# Patient Record
Sex: Male | Born: 1935 | Race: White | Hispanic: No | Marital: Married | Smoking: Never smoker
Health system: Southern US, Community
[De-identification: ages and names within clinical notes are randomized; demographics above are authoritative.]

## PROBLEM LIST (undated history)

## (undated) DIAGNOSIS — C801 Malignant (primary) neoplasm, unspecified: Secondary | ICD-10-CM

## (undated) DIAGNOSIS — I10 Essential (primary) hypertension: Secondary | ICD-10-CM

## (undated) DIAGNOSIS — E785 Hyperlipidemia, unspecified: Secondary | ICD-10-CM

## (undated) HISTORY — PX: ELBOW SURGERY: SHX618

## (undated) HISTORY — PX: BLADDER SURGERY: SHX569

---

## 2001-05-14 ENCOUNTER — Encounter (INDEPENDENT_AMBULATORY_CARE_PROVIDER_SITE_OTHER): Payer: Self-pay

## 2001-05-14 ENCOUNTER — Ambulatory Visit (HOSPITAL_COMMUNITY): Admission: RE | Admit: 2001-05-14 | Discharge: 2001-05-14 | Payer: Self-pay | Admitting: *Deleted

## 2003-09-21 ENCOUNTER — Ambulatory Visit (HOSPITAL_COMMUNITY): Admission: RE | Admit: 2003-09-21 | Discharge: 2003-09-21 | Payer: Self-pay | Admitting: *Deleted

## 2003-09-21 ENCOUNTER — Encounter (INDEPENDENT_AMBULATORY_CARE_PROVIDER_SITE_OTHER): Payer: Self-pay | Admitting: Specialist

## 2005-08-29 ENCOUNTER — Ambulatory Visit (HOSPITAL_COMMUNITY): Admission: RE | Admit: 2005-08-29 | Discharge: 2005-08-29 | Payer: Self-pay | Admitting: Orthopedic Surgery

## 2005-11-25 ENCOUNTER — Emergency Department (HOSPITAL_COMMUNITY): Admission: EM | Admit: 2005-11-25 | Discharge: 2005-11-26 | Payer: Self-pay | Admitting: Emergency Medicine

## 2006-05-24 ENCOUNTER — Ambulatory Visit (HOSPITAL_COMMUNITY): Admission: RE | Admit: 2006-05-24 | Discharge: 2006-05-24 | Payer: Self-pay | Admitting: *Deleted

## 2006-05-24 ENCOUNTER — Encounter (INDEPENDENT_AMBULATORY_CARE_PROVIDER_SITE_OTHER): Payer: Self-pay | Admitting: Specialist

## 2006-10-15 ENCOUNTER — Emergency Department (HOSPITAL_COMMUNITY): Admission: EM | Admit: 2006-10-15 | Discharge: 2006-10-15 | Payer: Self-pay | Admitting: Emergency Medicine

## 2007-09-13 IMAGING — CR DG CHEST 2V
2 series · 2 of 2 positions shown · non-contrast
Comparison: none

CLINICAL DATA: Syncope.
 CHEST - 2 VIEW:

[w chest pa]
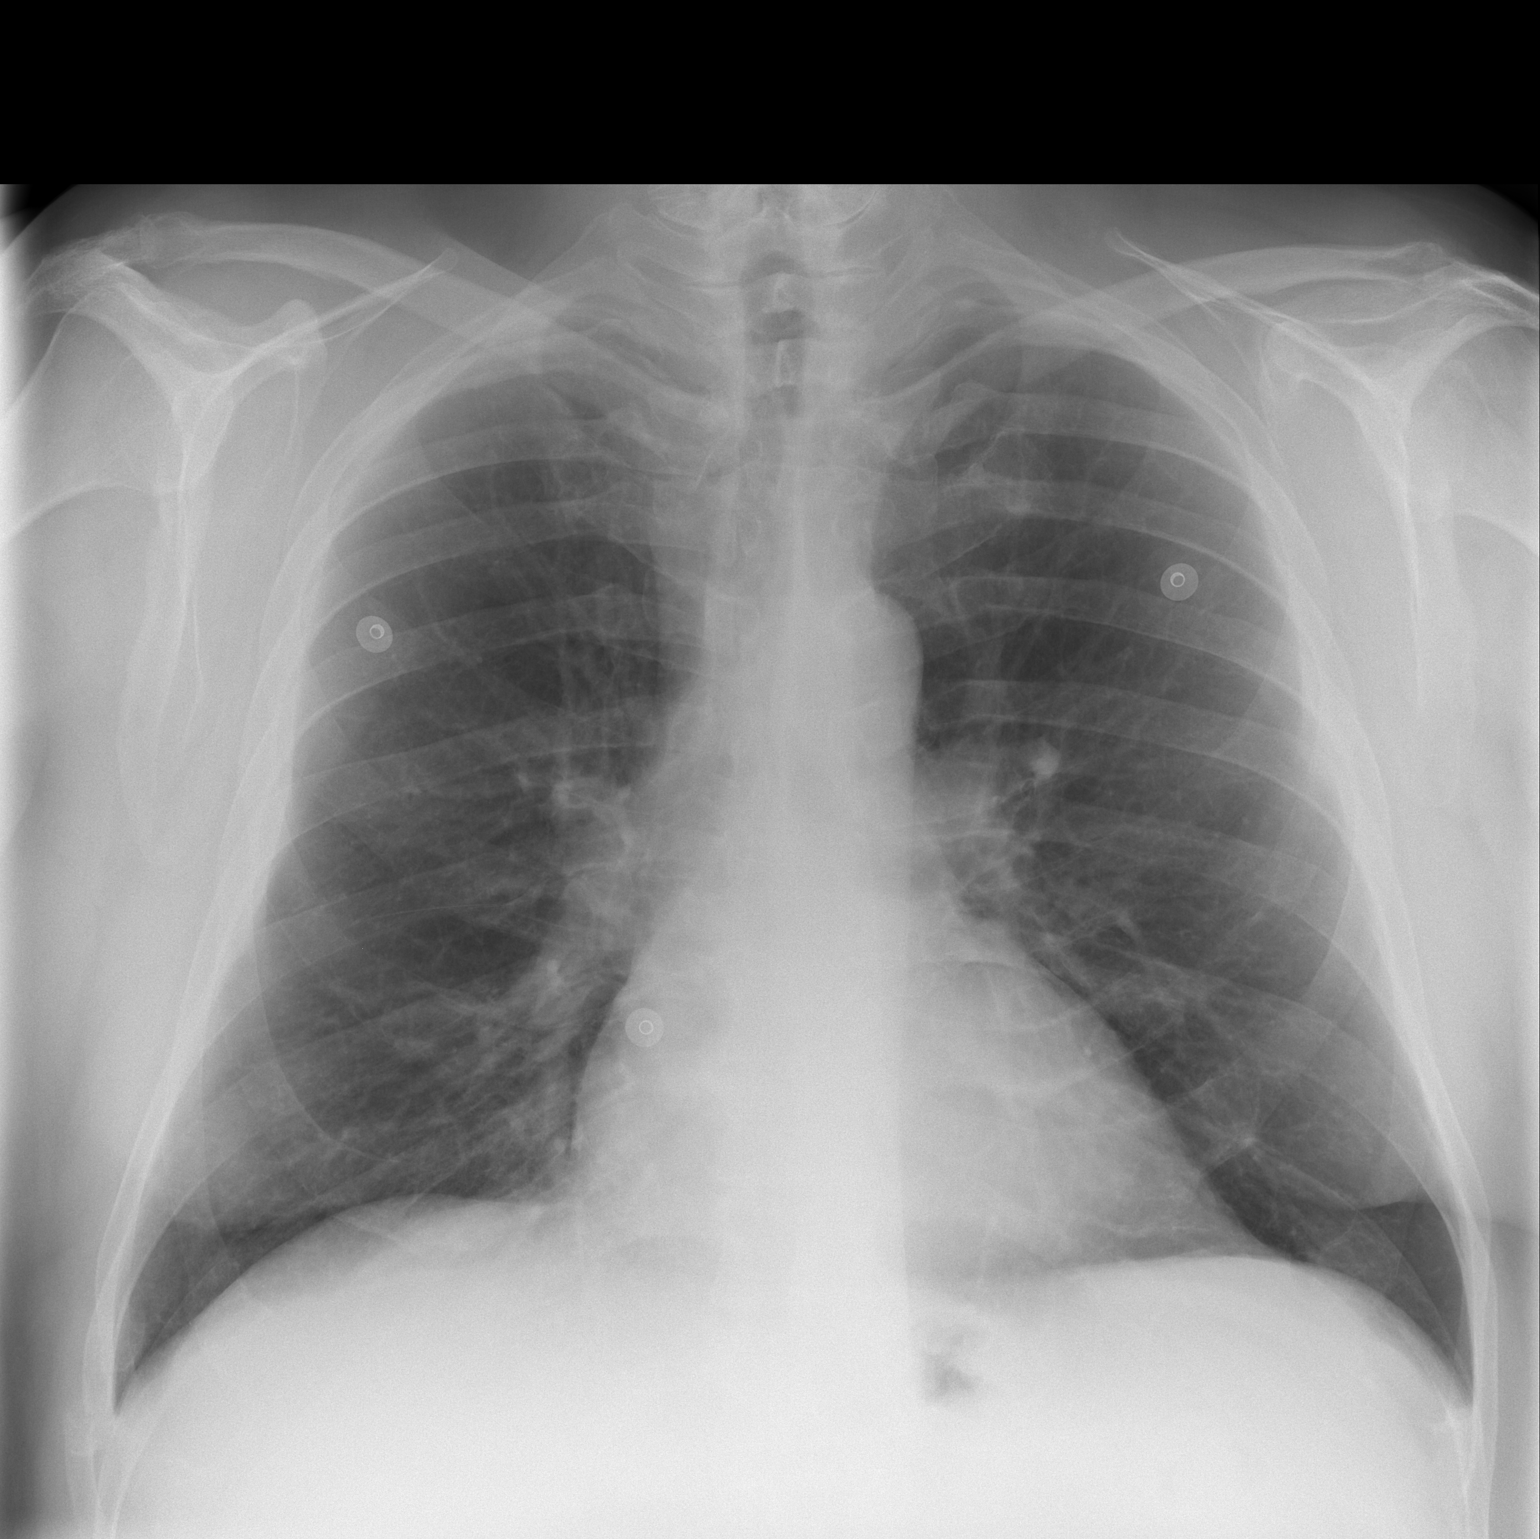

[w chest lat]
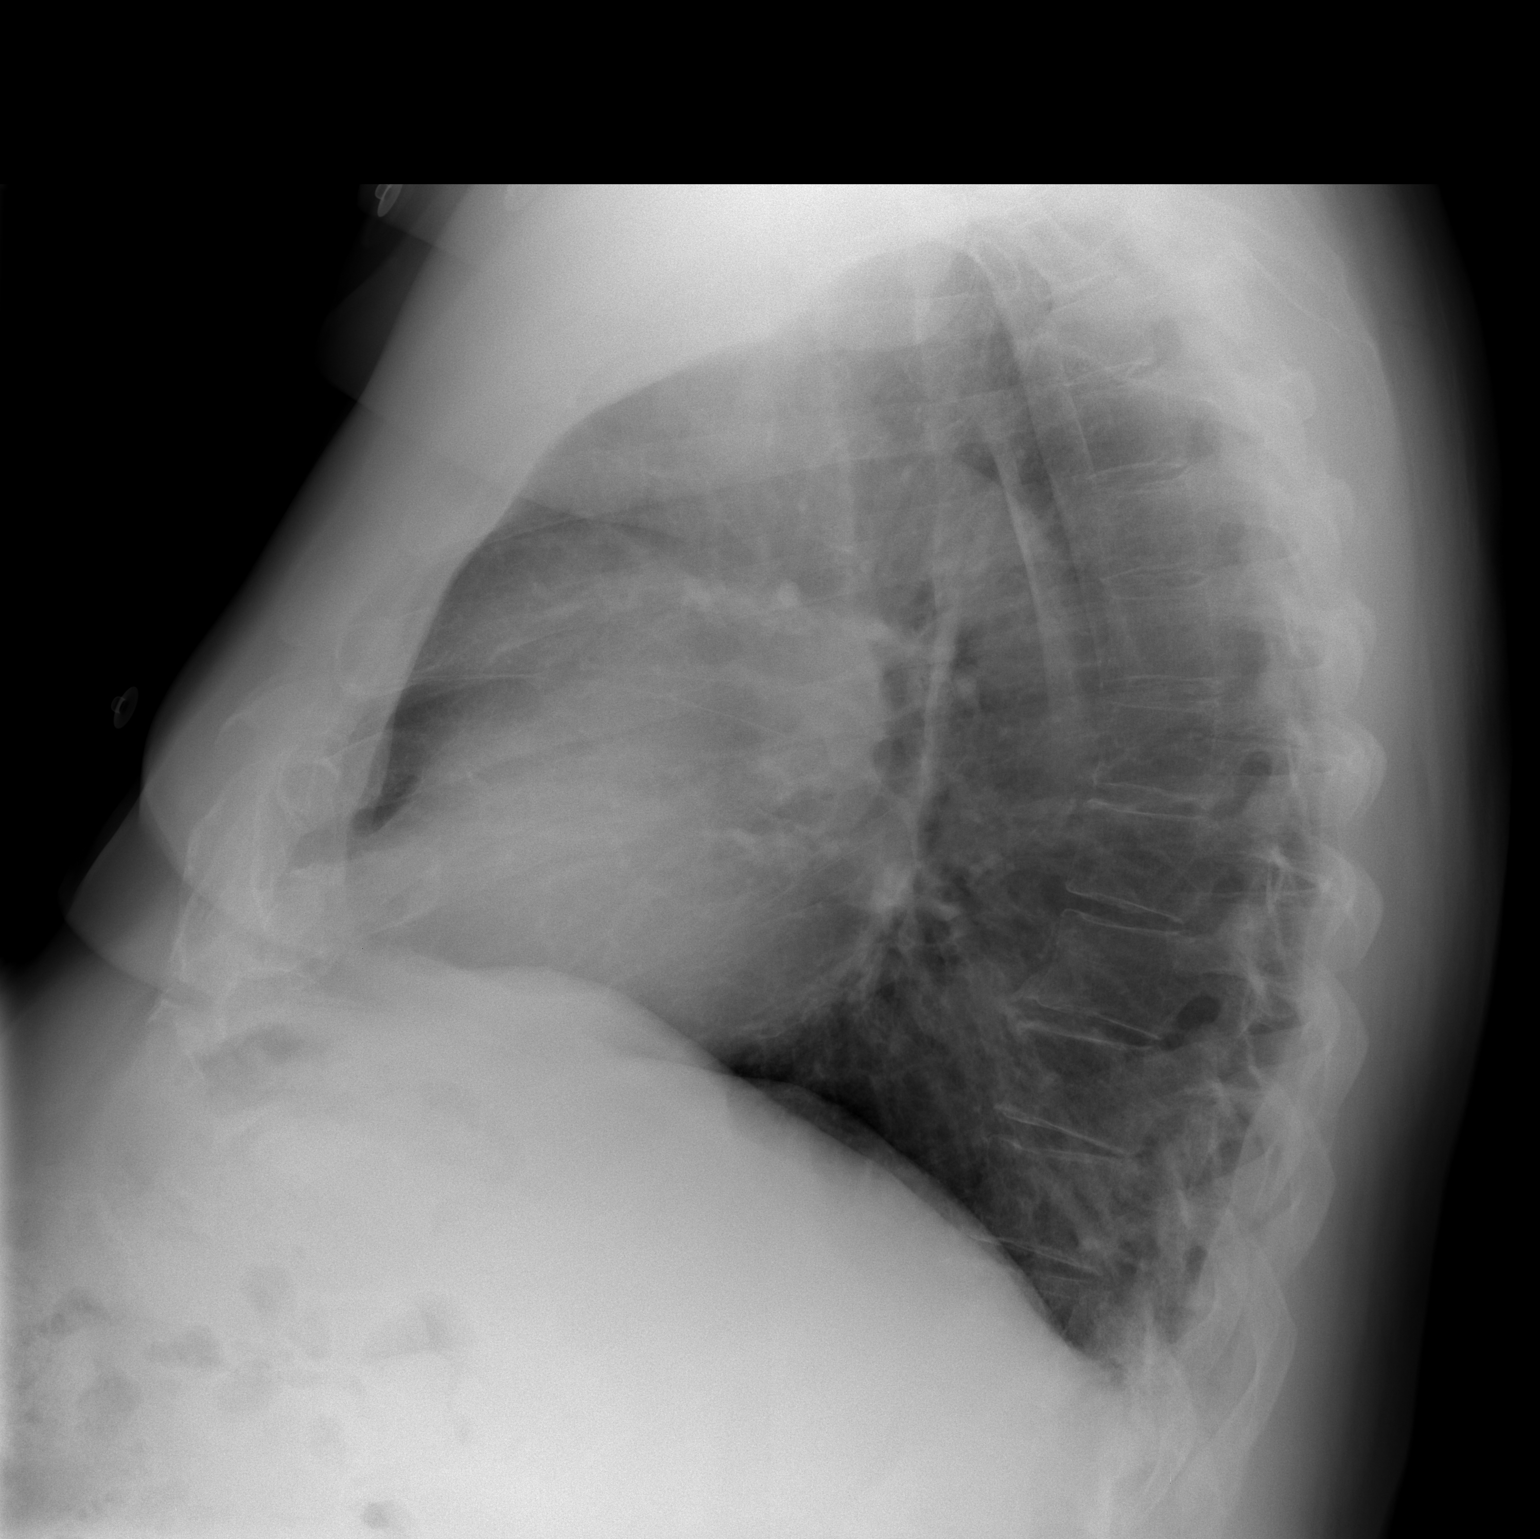

[2 of 2 positions shown; findings below may reference images not displayed]

FINDINGS: The lungs are clear.  Heart and mediastinum appear normal.  Costophrenic angles sharp.  No focal bony abnormality.
IMPRESSION: No acute disease.

## 2007-09-13 IMAGING — CT CT HEAD W/O CM
1 series · 16 of 30 positions shown, 20 images · IV contrast (agent unspecified)
Comparison: None

CLINICAL DATA: Syncope.  
 HEAD CT WITHOUT CONTRAST:
TECHNIQUE: Contiguous axial images were obtained from the base of the skull through the vertex according to standard protocol without contrast.

[Series 2: headseq 4.8 h45s · axial · 0.46mm/px · z∈[-152,+3]mm · 16 of 36 slices shown, 20 images]
[im 2/36  brain]
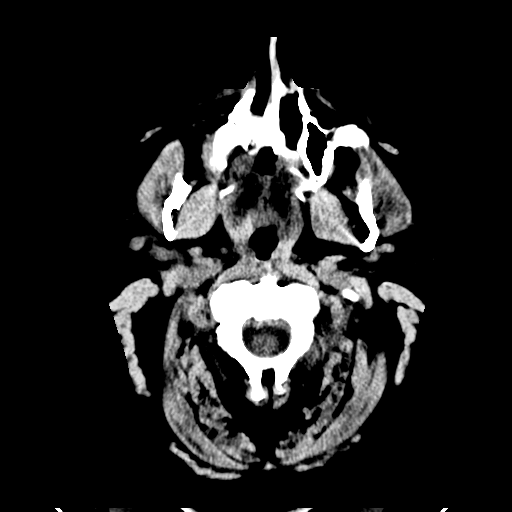
[im 2/36  bone]
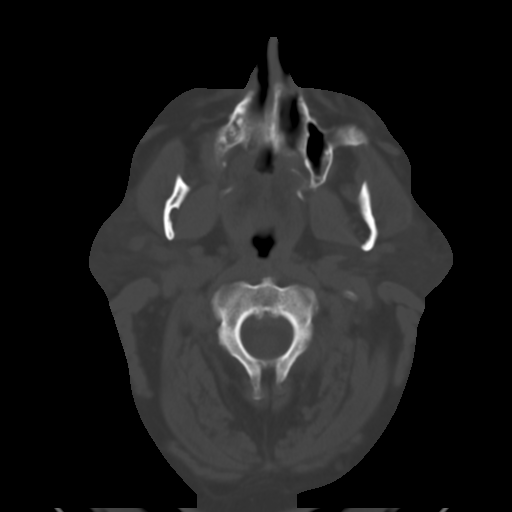
[im 4/36  brain]
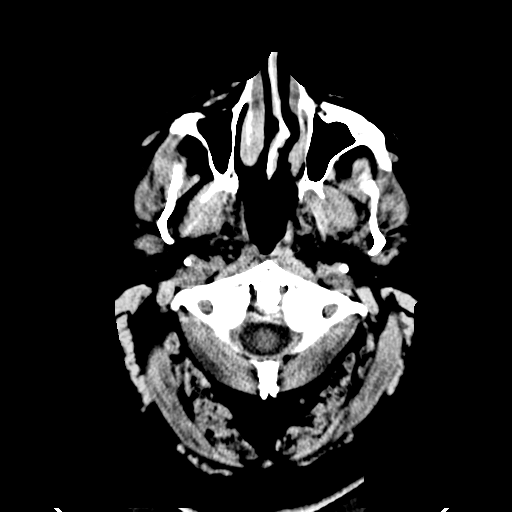
[im 7/36  brain]
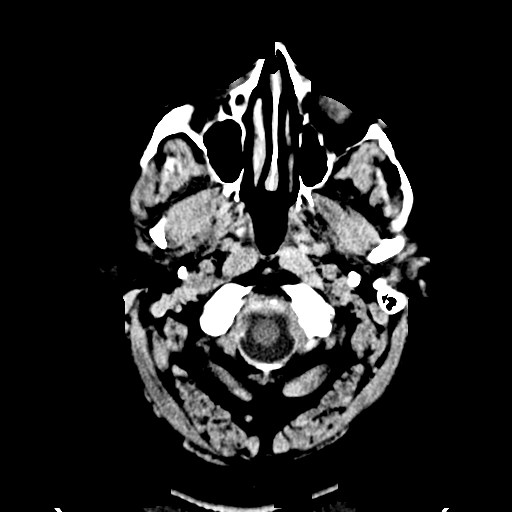
[im 9/36  brain]
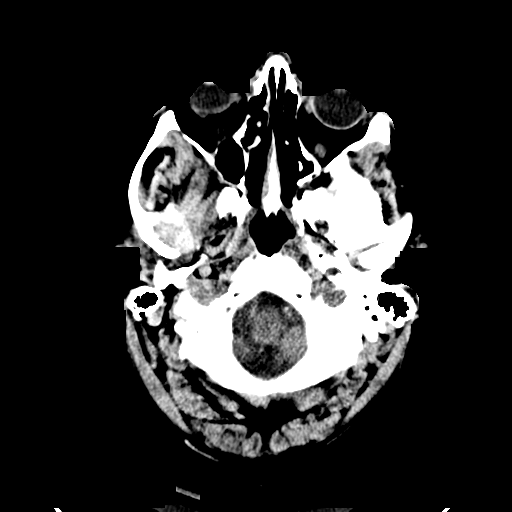
[im 10/36  brain]
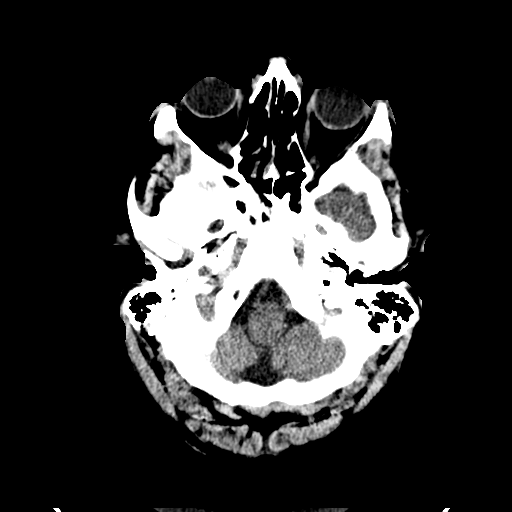
[im 10/36  bone]
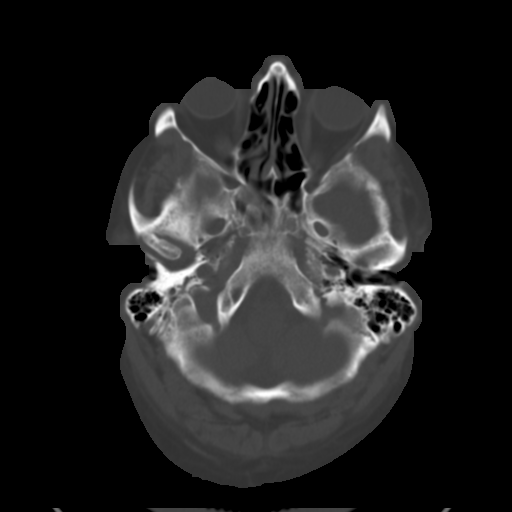
[im 13/36  brain]
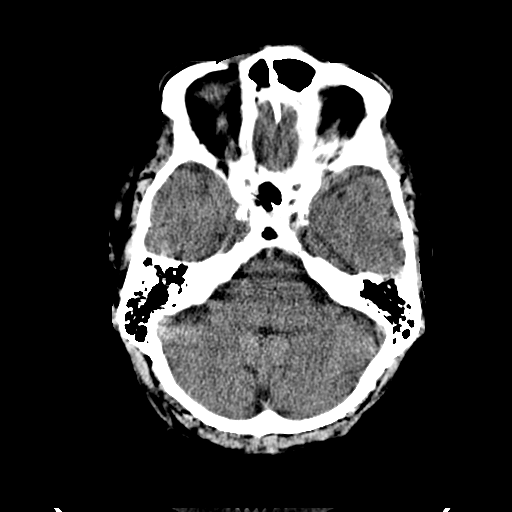
[im 15/36  brain]
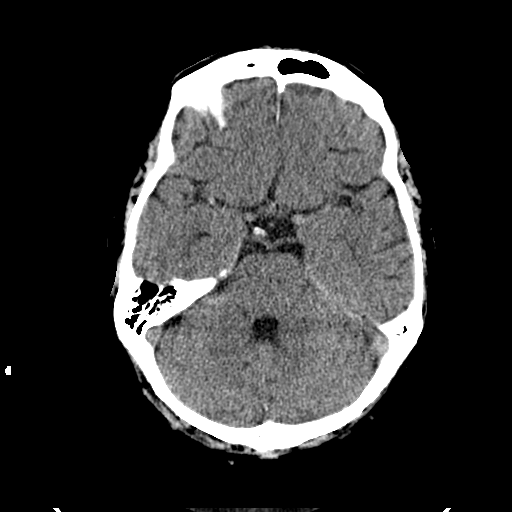
[im 17/36  brain]
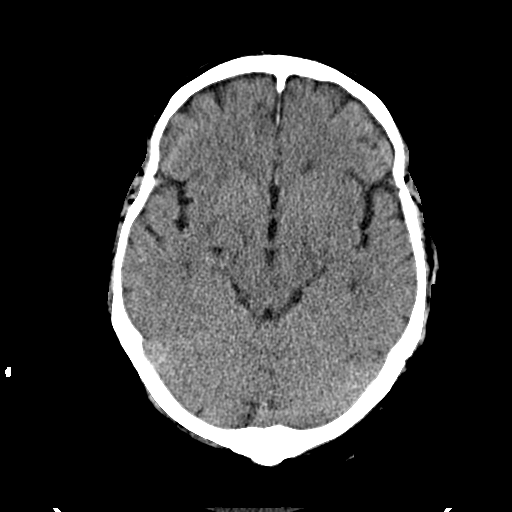
[im 19/36  brain]
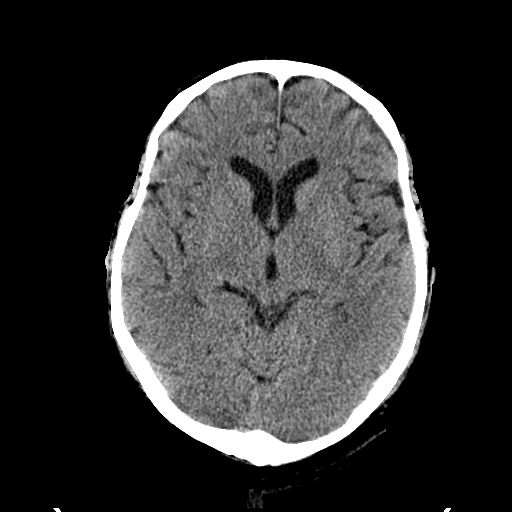
[im 19/36  bone]
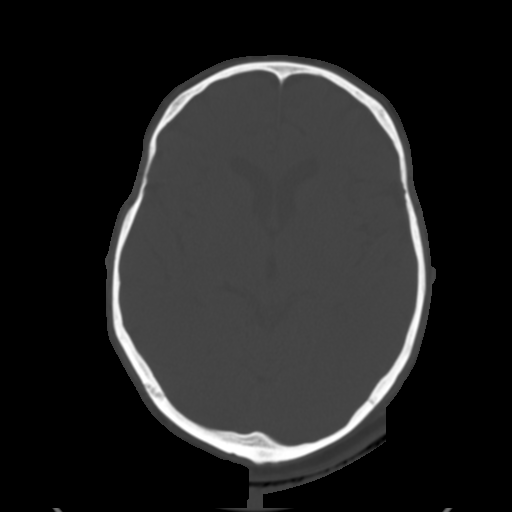
[im 21/36  brain]
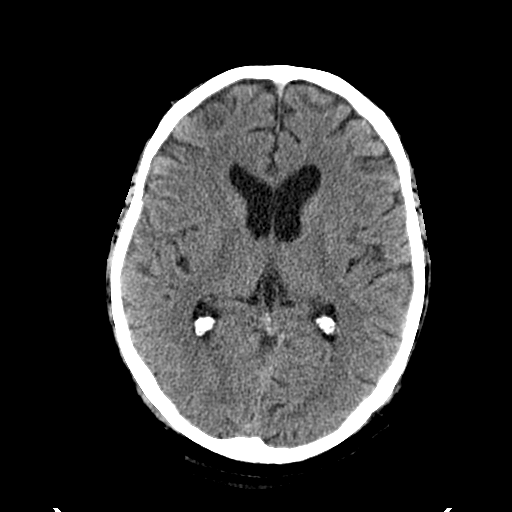
[im 23/36  brain]
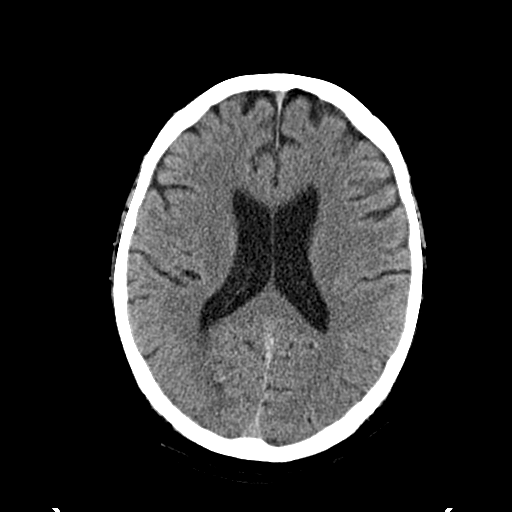
[im 26/36  brain]
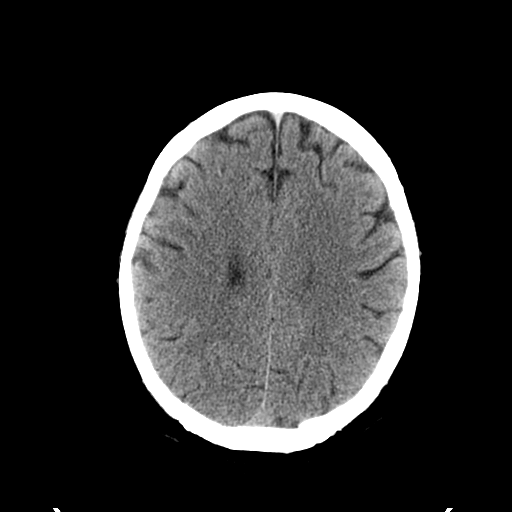
[im 27/36  brain]
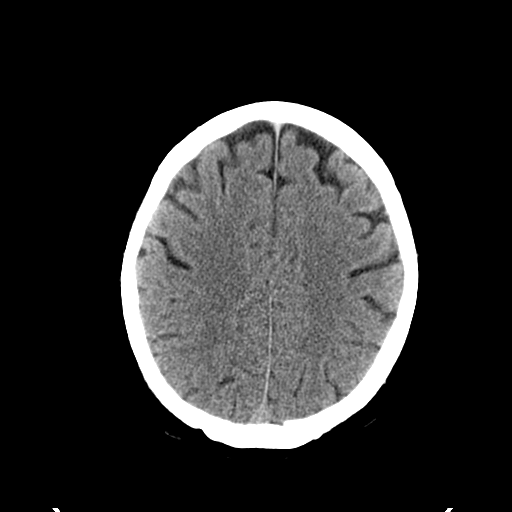
[im 27/36  bone]
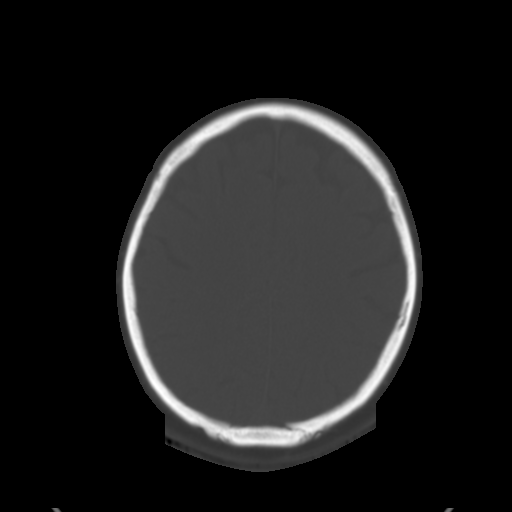
[im 29/36  brain]
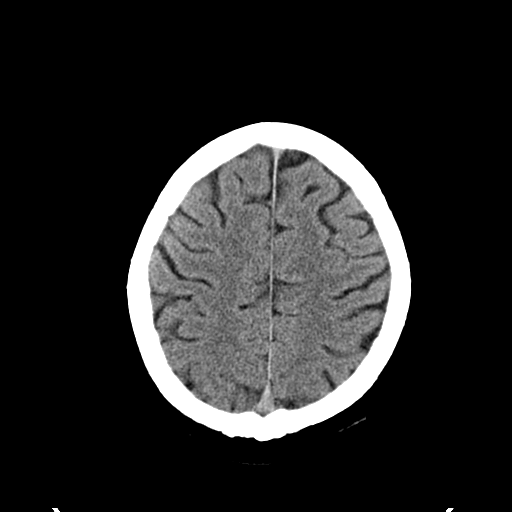
[im 32/36  brain]
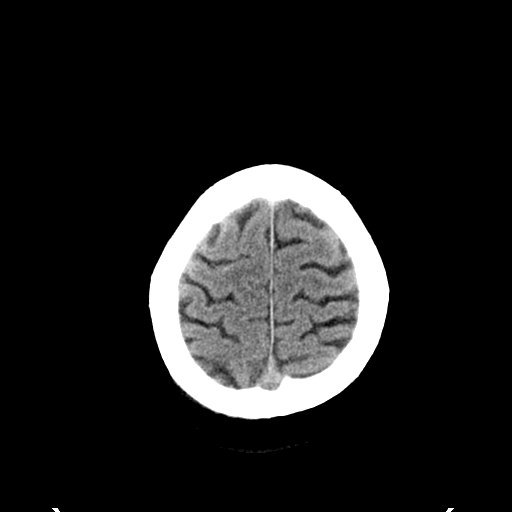
[im 34/36  brain]
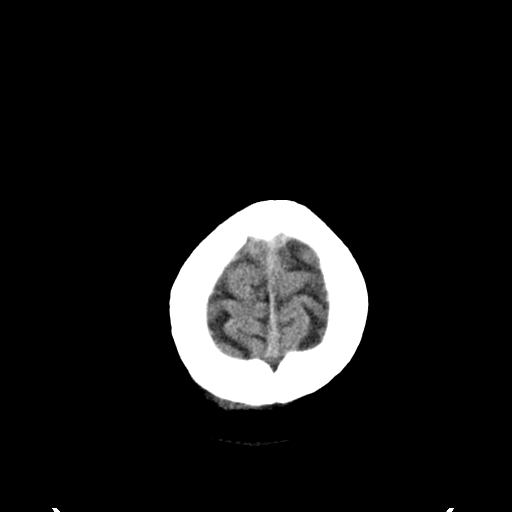

[16 of 30 positions shown; findings below may reference images not displayed]

FINDINGS: There is no evidence of acute intracranial abnormality including hemorrhage, infarct, mass, mass effect, midline shift or abnormal extraaxial fluid collection.   No hydrocephalus.  Imaged paranasal sinuses and mastoid air cells are clear.  No focal bony abnormality.
IMPRESSION: No acute intracranial abnormality.  Negative exam.

## 2009-06-16 ENCOUNTER — Encounter: Admission: RE | Admit: 2009-06-16 | Discharge: 2009-06-16 | Payer: Self-pay | Admitting: Internal Medicine

## 2010-06-30 NOTE — Procedures (Signed)
[facility]  Patient:    Jose Nash, Jose Nash. Visit Number: [id number] MRN: [id number]          Service Type: Attending:  Sabino Gasser, M.D. Dictated by:   Sabino Gasser, M.D.                             Procedure Report  CONTINUATION:  ADDENDUM TO END OF PROCEDURE:  Prep was Visicol tablets.  Well-tolerated by the patient and prep was good. Dictated by:   Sabino Gasser, M.D. Attending:  Sabino Gasser, M.D. DD:  05/14/01 TD:  05/15/01 Job: [id number]

## 2010-06-30 NOTE — Procedures (Signed)
[facility]  Patient:    Jose Nash, Jose Nash Visit Number: [id number] MRN: [id number]          Service Type: Attending:  Sabino Gasser, M.D. Dictated by:   Sabino Gasser, M.D.   CC:         Lacretia Leigh. Quintella Reichert, M.D.   Procedure Report  PROCEDURE:  Upper endoscopy.  SURGEON:  Sabino Gasser, M.D.  INDICATIONS:  GERD and dysphagia.  ANESTHESIA:  Demerol 40 and Versed 5 mg.  DESCRIPTION OF PROCEDURE:  With the patient mildly sedated in the left lateral decubitus position, the Olympus videoscopic endoscope was inserted in the mouth and passed under direct vision through the esophagus.  The distal esophagus was approached and showed a nodular stricture right at the GE junction.  This was photographed.  We entered into the stomach.  The fundus, body, antrum, duodenal bulb, and second portion of duodenum all appeared normal.  From this point, the endoscope was slowly withdrawn, taking circumferential views of the entire duodenal mucosa until the endoscope had been pulled back into the stomach and placed on retroflexion to view the stomach from below.  The endoscope was then straightened and placed in the distal stomach and a guidewire was passed.  The endoscope was removed, taking circumferential views of the remaining gastric and esophageal mucosa. Subsequently, over the guidewire, a Savary 15 mm and 19 mm dilators were passed with the last.  The guidewire was removed.  The endoscope was reinserted and a minimal amount of bleeding as to be expected was noted, indicating correct dilation of the distal stricture.  No other abnormalities were seen and the scope was withdrawn.  The patients vital signs and pulse oximeter remained stable.  The patient tolerated the procedure well without apparent complications.  FINDINGS:  Successful stricture dilation to 15 and 19 Savary dilation.  PLAN:  Liquids today, resume regular diet tomorrow, and have patient to  follow up with me as an outpatient to assess clinical response.  Proceed to colonoscopy as planned. Dictated by:   Sabino Gasser, M.D. Attending:  Sabino Gasser, M.D. DD:  05/14/01 TD:  05/15/01 Job: [id number]

## 2010-06-30 NOTE — Op Note (Signed)
NAME:  Jose Nash, HEMME NO.:  [id number]   MEDICAL RECORD NO.:  [id number]          PATIENT TYPE:  AMB   LOCATION:  ENDO                         FACILITY:  [facility]   PHYSICIAN:  Georgiana Spinner, M.D.    DATE OF BIRTH:  1933-06-20   DATE OF PROCEDURE:  DATE OF DISCHARGE:                               OPERATIVE REPORT   PROCEDURE:  Colonoscopy.   INDICATIONS:  Colon polyps, colon cancer screening.   ANESTHESIA:  Fentanyl 50 mcg, Versed 5 mg.   PROCEDURE:  With the patient mildly sedated in the left lateral  decubitus position, the Pentax videoscopic colonoscope was inserted into  the rectum and passed under direct vision to the cecum identified by the  ileocecal valve and appendiceal orifice, both of which were  photographed.  From this point, the colonoscope was slowly withdrawn,  taking circumferential views of the colonic mucosa, stopping in the  descending colon where two polyps were seen, photographed, and removed  using hot biopsy forceps technique and snare cautery technique, both  with a setting of 20/200 blended current.  We withdrew then all the way  to the rectum, stopping only at 40 cm from the anal verge at which point  another polyp was seen.  It too was removed using hot biopsy forceps  technique with the same settings.  In the rectum, the endoscope was  placed in retroflexed view to view the anal canal from above.  Internal  hemorrhoids were seen and photographed.  The endoscope was straightened  and withdrawn.  The patient's vital signs and pulse oximeter remained  stable.  The patient tolerated the procedure well without apparent  complications.   FINDINGS:  1. Occasional diverticulum of sigmoid colon.  2. Internal hemorrhoids.  3. Polyps of at 40 cm from the anal verge and in the descending colon.   PLAN:  Await biopsy report.  The patient will call me for results and  follow up with me as an outpatient.     ______________________________  Georgiana Spinner, M.D.     GMO/MEDQ  D:  05/24/2006  T:  05/24/2006  Job:  [id number]

## 2010-06-30 NOTE — Op Note (Signed)
NAME:  Jose Nash, Jose Nash                       ACCOUNT NO.:  [id number]   MEDICAL RECORD NO.:  [id number]                   PATIENT TYPE:  AMB   LOCATION:  ENDO                                 FACILITY:  [facility]   PHYSICIAN:  Georgiana Spinner, M.D.                 DATE OF BIRTH:  [birth year]   DATE OF PROCEDURE:  09/21/2003  DATE OF DISCHARGE:                                 OPERATIVE REPORT   PROCEDURE:  Colonoscopy.   DESCRIPTION OF PROCEDURE:  With the patient mildly sedated in the left  lateral decubitus position, the Olympus videoscopic colonoscope was inserted  into the rectum  and passed under direct vision to the cecum -- identified  by the ileocecal valve and appendiceal orifice, both of which are  photographed.  From this point the colonoscope was slowly withdrawn, taking  circumferential views of the colonic mucosa; stopping first in the  descending colon, just distal to the splenic flexure (where multiple polyps  were seen and removed).  The first polyp was a large polyp and it was  removed using snare cautery technique.  However, the nurse pulled through  this without applying current, therefore we had to cauterize the base of  this with a hot biopsy forceps.  There were other smaller polyps in this  area that were removed by hot biopsy forceps and/or snare cautery technique  -- all at a setting of 20/200 with the Erbe pulse generator.  The latter of  which was a large enough polyp that it could not be suctioned through the  endoscope, so it was grasped with a Ross retrieval mat and pulled out to be  retrieved.   The endoscope was then reinserted back to this level and withdrawn, taking  circumferential views of the remaining colonic mucosa; stopping in the  rectum at approximately 20 cm from the anal verge, from which point another  large polyp was seen, photographed and removed use snare cautery technique  (setting was 20/200 blended current).  This was retrieved and by  sectioning  to the end of the endoscope.  The endoscope was then reinserted to this  level and the rectum was viewed, which appeared normal otherwise in direct  view and showed hemorrhoids on retroflexed view.   The endoscope was straightened and withdrawn.  The patient's vital signs and  pulse oximetry remained stable.  The patient tolerated the procedure well  and all without apparent complications.   FINDINGS:  This was a prolonged exam because of the numerous numbers of  polyps, especially in the descending colon, just distal to the splenic  flexure that were removed with both snare cautery technique and hot biopsy  forceps technique.  There was some concern for bleeding because of the  removal of one polyp done without cautery, although the remainder of the  base was grasped with a hot biopsy forceps to cauterize it.  There was also  a polyp in the rectum and hemorrhoids noted as well.   PLAN:  Await biopsy report.  The patient will call me for results and follow  up with me as an outpatient.  Avoid anticoagulants and NSAIDs for the next  two weeks.                                               Georgiana Spinner, M.D.    GMO/MEDQ  D:  09/21/2003  T:  09/21/2003  Job:  [id number]

## 2010-06-30 NOTE — Op Note (Signed)
NAME:  Jose Nash, Jose Nash                       ACCOUNT NO.:  [id number]   MEDICAL RECORD NO.:  [id number]                   PATIENT TYPE:  AMB   LOCATION:  ENDO                                 FACILITY:  [facility]   PHYSICIAN:  Georgiana Spinner, M.D.                 DATE OF BIRTH:  04/17/1933   DATE OF PROCEDURE:  DATE OF DISCHARGE:                                 OPERATIVE REPORT   PROCEDURE:  Upper endoscopy.   INDICATIONS FOR PROCEDURE:  Dysphagia with esophagitis.   ANESTHESIA:  Demerol 50, Versed 4 mg.   DESCRIPTION OF PROCEDURE:  With the patient mildly sedated in the left  lateral decubitus position, the Olympus videoscopic endoscope was inserted  in the mouth, passed under direct vision through the esophagus which showed  change of the esophagitis, moderate in nature, probably grade B to grade C  with an ulcer of [place] grading.  This was photographed and biopsies  taken.  We entered into the stomach.  Fundus, body, antrum, duodenal bulb  and second portion of duodenum all appeared normal.  From this point, the  endoscope was slowly withdrawn taking circumferential views of the lateral  mucosa until the endoscope had been pulled back in the stomach, placed in  retroflexion to view the stomach from below.  The endoscope was then  straightened and withdrawn, taking circumferential views of the gastric and  esophageal mucosa.  The patient's vital signs and pulse oximeter remained  stable.  The patient tolerated the procedure well without apparent  complications.   FINDINGS:  Changes of esophagitis LA class C with ulceration.   PLAN:  Did not dilate because of the ulcer, therefore will plan on treating  more aggressively with PPI and await clinical response.  Will have the  patient follow up with me as an outpatient and for the results of the  biopsies.                                               Georgiana Spinner, M.D.    GMO/MEDQ  D:  09/21/2003  T:  09/21/2003  Job:   [id number]

## 2010-06-30 NOTE — Procedures (Signed)
[facility]  Patient:    Jose Nash, Jose Nash. Visit Number: [id number] MRN: [id number]          Service Type: Attending:  Sabino Gasser, M.D. Dictated by:   Sabino Gasser, M.D.   CC:         Lacretia Leigh. Quintella Reichert, M.D.   Procedure Report  PROCEDURE:  Colonoscopy.  SURGEON:  Sabino Gasser, M.D.  INDICATIONS:  Hemoccult positivity, colon cancer screening.  ANESTHESIA:  Demerol 10 and Versed one extra milligram.  DESCRIPTION OF PROCEDURE:  With the patient mildly sedated in the left lateral decubitus position, a rectal exam was performed which was unremarkable. Subsequently, the Olympus videoscopic colonoscope was inserted into the rectum and passed under direct vision to the cecum, identified by the ileocecal valve and appendiceal orifice.  From this point, the colonoscope was slowly withdrawn, taking circumferential views of the entire colonic mucosa, stopping first and approximately two folds removed from the ileocecal valve, at which point, a small sessile polyp was seen, photographed, and removed using hot biopsy forceps technique, setting of 20/20 blunted current.  We stopped next at approximately 45 cm from the anal verge, at which point, two other polyps were seen fairly adjacent to each other.  They were also small and somewhat sessile.  One was removed using snare cautery technique, the other with hot biopsy forceps technique, both with a setting of 20/20 blunted current as there was some diverticulosis noted in the sigmoid colon as we pulled back to the rectum which appeared normal on direct and retroflexed view.  The endoscope was then straightened and withdrawn.  The patients vital signs and pulse oximeter remained stable.  The patient tolerated the procedure well and without apparent complications.  FINDINGS:  Polyps of the ascending colon and in the sigmoid area at approximately 45 cm from the anal verge, await biopsy report.  Diverticulosis of  sigmoid colon.  PLAN:  See endoscopy note as well.  We will have the patient call me for the results of the biopsy and follow up with me as an outpatient. Dictated by:   Sabino Gasser, M.D. Attending:  Sabino Gasser, M.D. DD:  05/14/01 TD:  05/15/01 Job: [id number]

## 2010-06-30 NOTE — Consult Note (Signed)
NAME:  Jose Nash, Jose Nash NO.:  [id number]   MEDICAL RECORD NO.:  [id number]          PATIENT TYPE:  EMS   LOCATION:  ED                           FACILITY:  [facility]   PHYSICIAN:  Thora Lance, M.D.  DATE OF BIRTH:  Jun 25, 1933   DATE OF CONSULTATION:  DATE OF DISCHARGE:  11/26/2005                                   CONSULTATION   CHIEF COMPLAINT:  Syncope.   HISTORY OF PRESENT ILLNESS:  This is a 77 year old white male with a history  of hyperlipidemia, depression, gout, hypertension, who presents with two  episodes of syncope.  He was driving home today from the store.  He got out  of the car and his grandchildren came up to meet him.  He squatted down and  hugged his first grandchild and then the second and afterwards, sudden onset  of a syncopal episode.  He fell back and hit his head.  He got up  immediately and felt fine and went into the house.  He was blowing up some  balloons for his grandchildren.  After blowing the balloons, he immediately  had a coughing fit. This again resulted in the presyncopal episode.  He  denies any chest pain, shortness of breath or palpitations.  He has had a  congested cough productive of a small amount of phlegm over the last five  days.  He has had no history of syncope.   PAST MEDICAL HISTORY:  1. Hypertension.  2. Depression.  3. Hyperlipidemia.   MEDICATIONS:  1. Prilosec.  2. Simvastatin.  3. Aspirin.  4. Lexapro.   ALLERGIES:  NO KNOWN DRUG ALLERGIES.   FAMILY HISTORY:  Not addressed.   SOCIAL HISTORY:  Married.  Nonsmoker, moderate alcohol.   PHYSICAL EXAMINATION:  GENERAL APPEARANCE:  A well-appearing white male.  VITAL SIGNS:  Blood pressure __________, blood pressure 143/84, heart rate  79, respirations 20, O2 saturation 95% on room air.  HEENT:  Oropharynx clear.  NECK:  Supple.  LUNGS:  Clear.  CARDIOVASCULAR:  Regular rate and rhythm without murmurs, rubs, or gallops.  ABDOMEN:  Soft, nontender, no  mass or hepatosplenomegaly.  EXTREMITIES:  No edema.  NEUROLOGIC:  Nonfocal.   LABORATORY DATA:  CBC with wbc dipped to 5.2, hemoglobin 12.9, platelet  count 204.  Sodium 130, potassium 3.7, chloride 102, bicarbonate 27, glucose  129, BUN 11, creatinine 1.3, calcium 9.4.   Chest x-ray with no acute disease.  CT scan of the brain with no acute  disease.   EKG with normal sinus rhythm, normal EKG.   ASSESSMENT:  1. Two episodes of syncope, both related to maneuvers which would decrease      return of blood to the thorax.  In one case, he had prolonged      squatting.  In the second case, he was blowing up a balloon and then      had a coughing fit.  2. Acute bronchitis.   PLAN:  1. Discharge to home.  2. Plenty of fluids.  3. Z-pack.  4. No driving until I see him in the office in  five days.  He will call      for an appointment.  5. If he has any further syncope or develops any chest pain, shortness of      breath or palpitations, let me know.           ______________________________  Thora Lance, M.D.     JJG/MEDQ  D:  11/26/2005  T:  11/26/2005  Job:  [id number]

## 2010-10-18 ENCOUNTER — Other Ambulatory Visit: Payer: Self-pay | Admitting: Gastroenterology

## 2011-04-30 ENCOUNTER — Other Ambulatory Visit: Payer: Self-pay | Admitting: Dermatology

## 2013-03-19 ENCOUNTER — Inpatient Hospital Stay (HOSPITAL_COMMUNITY): Payer: Medicare Other

## 2013-03-19 ENCOUNTER — Encounter (HOSPITAL_COMMUNITY): Payer: Self-pay | Admitting: Internal Medicine

## 2013-03-19 ENCOUNTER — Inpatient Hospital Stay (HOSPITAL_COMMUNITY)
Admission: AD | Admit: 2013-03-19 | Discharge: 2013-03-24 | DRG: 871 | Disposition: A | Discharge status: Home Health | Payer: Medicare Other | Source: Other Acute Inpatient Hospital | Attending: Internal Medicine | Admitting: Internal Medicine

## 2013-03-19 DIAGNOSIS — I517 Cardiomegaly: Secondary | ICD-10-CM

## 2013-03-19 DIAGNOSIS — N390 Urinary tract infection, site not specified: Secondary | ICD-10-CM | POA: Diagnosis present

## 2013-03-19 DIAGNOSIS — E785 Hyperlipidemia, unspecified: Secondary | ICD-10-CM | POA: Diagnosis present

## 2013-03-19 DIAGNOSIS — Z932 Ileostomy status: Secondary | ICD-10-CM

## 2013-03-19 DIAGNOSIS — R7881 Bacteremia: Secondary | ICD-10-CM | POA: Diagnosis present

## 2013-03-19 DIAGNOSIS — N179 Acute kidney failure, unspecified: Secondary | ICD-10-CM | POA: Diagnosis present

## 2013-03-19 DIAGNOSIS — C679 Malignant neoplasm of bladder, unspecified: Secondary | ICD-10-CM | POA: Diagnosis present

## 2013-03-19 DIAGNOSIS — N183 Chronic kidney disease, stage 3 unspecified: Secondary | ICD-10-CM | POA: Diagnosis present

## 2013-03-19 DIAGNOSIS — D72829 Elevated white blood cell count, unspecified: Secondary | ICD-10-CM | POA: Diagnosis present

## 2013-03-19 DIAGNOSIS — A4159 Other Gram-negative sepsis: Principal | ICD-10-CM | POA: Diagnosis present

## 2013-03-19 DIAGNOSIS — R6520 Severe sepsis without septic shock: Secondary | ICD-10-CM

## 2013-03-19 DIAGNOSIS — Z7982 Long term (current) use of aspirin: Secondary | ICD-10-CM

## 2013-03-19 DIAGNOSIS — E872 Acidosis, unspecified: Secondary | ICD-10-CM | POA: Diagnosis present

## 2013-03-19 DIAGNOSIS — I10 Essential (primary) hypertension: Secondary | ICD-10-CM | POA: Diagnosis present

## 2013-03-19 DIAGNOSIS — I4891 Unspecified atrial fibrillation: Secondary | ICD-10-CM | POA: Diagnosis not present

## 2013-03-19 DIAGNOSIS — R6521 Severe sepsis with septic shock: Secondary | ICD-10-CM

## 2013-03-19 DIAGNOSIS — N039 Chronic nephritic syndrome with unspecified morphologic changes: Secondary | ICD-10-CM

## 2013-03-19 DIAGNOSIS — Z8551 Personal history of malignant neoplasm of bladder: Secondary | ICD-10-CM

## 2013-03-19 DIAGNOSIS — N200 Calculus of kidney: Secondary | ICD-10-CM | POA: Diagnosis present

## 2013-03-19 DIAGNOSIS — I129 Hypertensive chronic kidney disease with stage 1 through stage 4 chronic kidney disease, or unspecified chronic kidney disease: Secondary | ICD-10-CM | POA: Diagnosis present

## 2013-03-19 DIAGNOSIS — Z79899 Other long term (current) drug therapy: Secondary | ICD-10-CM

## 2013-03-19 DIAGNOSIS — B9689 Other specified bacterial agents as the cause of diseases classified elsewhere: Secondary | ICD-10-CM

## 2013-03-19 DIAGNOSIS — D649 Anemia, unspecified: Secondary | ICD-10-CM | POA: Diagnosis present

## 2013-03-19 DIAGNOSIS — D631 Anemia in chronic kidney disease: Secondary | ICD-10-CM | POA: Diagnosis present

## 2013-03-19 DIAGNOSIS — A419 Sepsis, unspecified organism: Secondary | ICD-10-CM | POA: Diagnosis present

## 2013-03-19 DIAGNOSIS — N189 Chronic kidney disease, unspecified: Secondary | ICD-10-CM | POA: Diagnosis present

## 2013-03-19 HISTORY — DX: Essential (primary) hypertension: I10

## 2013-03-19 HISTORY — DX: Hyperlipidemia, unspecified: E78.5

## 2013-03-19 HISTORY — DX: Malignant (primary) neoplasm, unspecified: C80.1

## 2013-03-19 LAB — BASIC METABOLIC PANEL
BUN: 61 mg/dL — ABNORMAL HIGH (ref 6–23)
CALCIUM: 8.2 mg/dL — AB (ref 8.4–10.5)
CO2: 16 mEq/L — ABNORMAL LOW (ref 19–32)
CREATININE: 3.55 mg/dL — AB (ref 0.50–1.35)
Chloride: 101 mEq/L (ref 96–112)
GFR, EST AFRICAN AMERICAN: 18 mL/min — AB (ref >90)
GFR, EST NON AFRICAN AMERICAN: 15 mL/min — AB (ref >90)
Glucose, Bld: 132 mg/dL — ABNORMAL HIGH (ref 70–99)
Potassium: 4.7 mEq/L (ref 3.7–5.3)
Sodium: 134 mEq/L — ABNORMAL LOW (ref 137–147)

## 2013-03-19 LAB — URINALYSIS, ROUTINE W REFLEX MICROSCOPIC
BILIRUBIN URINE: NEGATIVE
GLUCOSE, UA: NEGATIVE mg/dL
Ketones, ur: NEGATIVE mg/dL
Nitrite: NEGATIVE
PH: 7 (ref 5.0–8.0)
Protein, ur: 100 mg/dL — AB
SPECIFIC GRAVITY, URINE: 1.011 (ref 1.005–1.030)
UROBILINOGEN UA: 0.2 mg/dL (ref 0.0–1.0)

## 2013-03-19 LAB — URINE MICROSCOPIC-ADD ON

## 2013-03-19 LAB — CBC WITH DIFFERENTIAL/PLATELET
Basophils Absolute: 0 10*3/uL (ref 0.0–0.1)
Basophils Relative: 0 % (ref 0–1)
EOS ABS: 0.1 10*3/uL (ref 0.0–0.7)
EOS PCT: 1 % (ref 0–5)
HCT: 30.9 % — ABNORMAL LOW (ref 39.0–52.0)
HEMOGLOBIN: 10.7 g/dL — AB (ref 13.0–17.0)
LYMPHS ABS: 0.6 10*3/uL — AB (ref 0.7–4.0)
LYMPHS PCT: 4 % — AB (ref 12–46)
MCH: 29.8 pg (ref 26.0–34.0)
MCHC: 34.6 g/dL (ref 30.0–36.0)
MCV: 86.1 fL (ref 78.0–100.0)
MONOS PCT: 4 % (ref 3–12)
Monocytes Absolute: 0.6 10*3/uL (ref 0.1–1.0)
Neutro Abs: 12.7 10*3/uL — ABNORMAL HIGH (ref 1.7–7.7)
Neutrophils Relative %: 91 % — ABNORMAL HIGH (ref 43–77)
Platelets: 139 10*3/uL — ABNORMAL LOW (ref 150–400)
RBC: 3.59 MIL/uL — AB (ref 4.22–5.81)
RDW: 15.5 % (ref 11.5–15.5)
WBC: 14.1 10*3/uL — AB (ref 4.0–10.5)

## 2013-03-19 LAB — COMPREHENSIVE METABOLIC PANEL
ALK PHOS: 55 U/L (ref 39–117)
ALT: 11 U/L (ref 0–53)
AST: 14 U/L (ref 0–37)
Albumin: 2.6 g/dL — ABNORMAL LOW (ref 3.5–5.2)
BUN: 60 mg/dL — AB (ref 6–23)
CO2: 17 meq/L — AB (ref 19–32)
Calcium: 8.2 mg/dL — ABNORMAL LOW (ref 8.4–10.5)
Chloride: 103 mEq/L (ref 96–112)
Creatinine, Ser: 3.76 mg/dL — ABNORMAL HIGH (ref 0.50–1.35)
GFR calc Af Amer: 16 mL/min — ABNORMAL LOW (ref >90)
GFR, EST NON AFRICAN AMERICAN: 14 mL/min — AB (ref >90)
Glucose, Bld: 106 mg/dL — ABNORMAL HIGH (ref 70–99)
Potassium: 4.4 mEq/L (ref 3.7–5.3)
Sodium: 138 mEq/L (ref 137–147)
TOTAL PROTEIN: 6.7 g/dL (ref 6.0–8.3)
Total Bilirubin: 0.3 mg/dL (ref 0.3–1.2)

## 2013-03-19 LAB — GLUCOSE, CAPILLARY
GLUCOSE-CAPILLARY: 117 mg/dL — AB (ref 70–99)
GLUCOSE-CAPILLARY: 99 mg/dL (ref 70–99)
GLUCOSE-CAPILLARY: 160 mg/dL — AB (ref 70–99)
Glucose-Capillary: 128 mg/dL — ABNORMAL HIGH (ref 70–99)
Glucose-Capillary: 129 mg/dL — ABNORMAL HIGH (ref 70–99)

## 2013-03-19 LAB — SODIUM, URINE, RANDOM: Sodium, Ur: 56 mEq/L

## 2013-03-19 LAB — AMMONIA: Ammonia: 30 umol/L (ref 11–60)

## 2013-03-19 LAB — PRO B NATRIURETIC PEPTIDE: Pro B Natriuretic peptide (BNP): 5160 pg/mL — ABNORMAL HIGH (ref 0–450)

## 2013-03-19 LAB — TSH: TSH: 0.752 u[IU]/mL (ref 0.350–4.500)

## 2013-03-19 LAB — LACTIC ACID, PLASMA: Lactic Acid, Venous: 0.8 mmol/L (ref 0.5–2.2)

## 2013-03-19 LAB — MRSA PCR SCREENING: MRSA by PCR: NEGATIVE

## 2013-03-19 LAB — TROPONIN I: Troponin I: <0.3 ng/mL (ref <0.30)

## 2013-03-19 LAB — CREATININE, URINE, RANDOM: CREATININE, URINE: 87.62 mg/dL

## 2013-03-19 LAB — PROTIME-INR
INR: 1.28 (ref 0.00–1.49)
PROTHROMBIN TIME: 15.7 s — AB (ref 11.6–15.2)

## 2013-03-19 LAB — APTT: aPTT: 42 seconds — ABNORMAL HIGH (ref 24–37)

## 2013-03-19 MED ORDER — HYDRALAZINE HCL 20 MG/ML IJ SOLN: 10.0000 mg | INTRAMUSCULAR | Status: DC | PRN

## 2013-03-19 MED ORDER — DILTIAZEM HCL 25 MG/5ML IV SOLN: 25.0000 mg | Freq: Once | INTRAVENOUS | Status: DC

## 2013-03-19 MED ORDER — DILTIAZEM HCL 25 MG/5ML IV SOLN
10.0000 mg | Freq: Once | INTRAVENOUS | Status: AC
  Administered 2013-03-19: 10 mg via INTRAVENOUS
  Filled 2013-03-19: qty 5

## 2013-03-19 MED ORDER — ACETAMINOPHEN 650 MG RE SUPP
650.0000 mg | Freq: Four times a day (QID) | RECTAL | Status: DC | PRN
Start: 2013-03-19 — End: 2013-03-24

## 2013-03-19 MED ORDER — SODIUM CHLORIDE 0.9 % IV SOLN
INTRAVENOUS | Status: AC
  Administered 2013-03-19: 06:00:00 via INTRAVENOUS

## 2013-03-19 MED ORDER — LEVOFLOXACIN IN D5W 750 MG/150ML IV SOLN
750.0000 mg | INTRAVENOUS | Status: AC
  Administered 2013-03-19: 750 mg via INTRAVENOUS
  Filled 2013-03-19: qty 150

## 2013-03-19 MED ORDER — DEXTROSE 5 % IV SOLN
5.0000 mg/h | INTRAVENOUS | Status: DC
  Administered 2013-03-19: 15 mg/h via INTRAVENOUS
  Administered 2013-03-19: 5 mg/h via INTRAVENOUS
  Administered 2013-03-19: 15 mg/h via INTRAVENOUS
  Administered 2013-03-19: 20 mg/h via INTRAVENOUS
  Administered 2013-03-20: 5 mg/h via INTRAVENOUS
  Filled 2013-03-19 (×6): qty 100

## 2013-03-19 MED ORDER — ONDANSETRON HCL 4 MG/2ML IJ SOLN
4.0000 mg | Freq: Four times a day (QID) | INTRAMUSCULAR | Status: DC | PRN
Start: 2013-03-19 — End: 2013-03-24

## 2013-03-19 MED ORDER — LEVOFLOXACIN IN D5W 500 MG/100ML IV SOLN
500.0000 mg | INTRAVENOUS | Status: DC
  Administered 2013-03-21: 500 mg via INTRAVENOUS
  Filled 2013-03-19: qty 100

## 2013-03-19 MED ORDER — ACETAMINOPHEN 325 MG PO TABS
650.0000 mg | ORAL_TABLET | Freq: Four times a day (QID) | ORAL | Status: DC | PRN
  Administered 2013-03-20: 650 mg via ORAL
  Filled 2013-03-19: qty 2

## 2013-03-19 MED ORDER — HEPARIN (PORCINE) IN NACL 100-0.45 UNIT/ML-% IJ SOLN
2750.0000 [IU]/h | INTRAMUSCULAR | Status: DC
  Administered 2013-03-19: 1600 [IU]/h via INTRAVENOUS
  Administered 2013-03-20: 1900 [IU]/h via INTRAVENOUS
  Administered 2013-03-21: 2450 [IU]/h via INTRAVENOUS
  Administered 2013-03-21: 2300 [IU]/h via INTRAVENOUS
  Administered 2013-03-22 – 2013-03-23 (×3): 2750 [IU]/h via INTRAVENOUS
  Filled 2013-03-19 (×13): qty 250

## 2013-03-19 MED ORDER — ONDANSETRON HCL 4 MG PO TABS
4.0000 mg | ORAL_TABLET | Freq: Four times a day (QID) | ORAL | Status: DC | PRN
Start: 2013-03-19 — End: 2013-03-24

## 2013-03-19 MED ORDER — SODIUM CHLORIDE 0.9 % IJ SOLN
3.0000 mL | Freq: Two times a day (BID) | INTRAMUSCULAR | Status: DC
  Administered 2013-03-19 – 2013-03-24 (×10): 3 mL via INTRAVENOUS

## 2013-03-19 MED ORDER — PIPERACILLIN-TAZOBACTAM 3.375 G IVPB
3.3750 g | Freq: Three times a day (TID) | INTRAVENOUS | Status: DC
  Administered 2013-03-19: 3.375 g via INTRAVENOUS
  Filled 2013-03-19 (×2): qty 50

## 2013-03-19 MED ORDER — DEXTROSE 5 % IV SOLN
2.0000 g | INTRAVENOUS | Status: DC
  Administered 2013-03-19 – 2013-03-21 (×3): 2 g via INTRAVENOUS
  Filled 2013-03-19 (×4): qty 2

## 2013-03-19 MED ORDER — HEPARIN BOLUS VIA INFUSION
5000.0000 [IU] | Freq: Once | INTRAVENOUS | Status: AC
  Administered 2013-03-19: 5000 [IU] via INTRAVENOUS
  Filled 2013-03-19: qty 5000

## 2013-03-19 NOTE — Progress Notes (Signed)
  Echocardiogram 2D Echocardiogram has been performed.  CANE, DUBRAY 03/19/2013, 5:53 PM

## 2013-03-19 NOTE — Progress Notes (Signed)
Patient ID: Jose Nash, male   DOB: 1933/03/31, 80 y.o.   MRN: [id number] Assuming care, H and P noted.  Patient developed A fib with RVR today, HD stable with DOE, give second diltiazem bolus and start diltiazem drip, check CXR, 2D echo and cardiac enzymes and BNP.

## 2013-03-19 NOTE — Consult Note (Signed)
Name: Jose Nash MRN: [id number] DOB: 11-Sep-1933    ADMISSION DATE:  03/19/2013 CONSULTATION DATE:  03/19/13  REFERRING MD :  Dr. Hal Hope PRIMARY SERVICE: PCCM  CHIEF COMPLAINT:  UTI, ACKD, hypotension  BRIEF PATIENT DESCRIPTION: 80yo male with hx bladder cancer s/p diverting ileostomy. Recent urology workup for nephrolithiasis passed spontaneously with CT abd showing perinephric stranding, also with WBC 30K, rising SCr, refractory hypotension to 4L NS, and GN bacteremia. Now having mental status changes and developed afib with RVR. Tx to [facility] by patient request for further workup and treatment of sepsis.  SIGNIFICANT EVENTS / STUDIES:  2/5 >> Transferred to [facility] for workup of septic shock  LINES / TUBES: End ileostomy>>>permanent PIV 2/3>>>  CULTURES: MRSA neg BC 2/5>>>gram neg rod>>> Urine 2/5>>>  ANTIBIOTICS: Zosyn 2/5>>>  HISTORY OF PRESENT ILLNESS:  80 yo caucasian male with PMH bladder cancer with diverting ileostomy, HTN, and nephrolithiasis who is followed by urology outpatient. Recently patient developed flank pain, fevers, chills, and sweating approx 5 days ago and underwent evaluation by his urologist on Tuesday 2/3. CT abdomen/pelvis showed passage of a 50mm stone and perinephric stranding. He had a reported WBC 30K and rising SCr (baseline 1.5) up to 2.5-3.1. Blood cultures revealed GNR and patient was started on Zosyn. He then developed AMS and afib with RVR and was transferred to [facility] for further workup.   PAST MEDICAL HISTORY :  Past Medical History  Diagnosis Date  . Hypertension   . Cancer   . HLD (hyperlipidemia)    Past Surgical History  Procedure Laterality Date  . Bladder surgery    . Elbow surgery     Prior to Admission medications   Not on File   No Known Allergies  FAMILY HISTORY:  Family History  Problem Relation Age of Onset  . Bladder Cancer Neg Hx    SOCIAL HISTORY:  reports that he has never smoked. He does not have any smokeless  tobacco history on file. He reports that he does not drink alcohol or use illicit drugs.  REVIEW OF SYSTEMS: N pos, vom pos, all else neg  SUBJECTIVE: no distress  VITAL SIGNS: Temp:  [98.1 F (36.7 C)-99.7 F (37.6 C)] 99.7 F (37.6 C) (02/05 0700) Pulse Rate:  [86-138] 138 (02/05 0730) Resp:  [20-30] 27 (02/05 0730) BP: (100-146)/(46-101) 113/60 mmHg (02/05 0700) SpO2:  [95 %-100 %] 95 % (02/05 0730) Weight:  [110.1 kg (242 lb 11.6 oz)] 110.1 kg (242 lb 11.6 oz) (02/05 0345) HEMODYNAMICS:   VENTILATOR SETTINGS:   INTAKE / OUTPUT: Intake/Output     02/04 0701 - 02/05 0700 02/05 0701 - 02/06 0700   I.V. (mL/kg) 41.3 (0.4)    Total Intake(mL/kg) 41.3 (0.4)    Urine (mL/kg/hr) 100    Total Output 100     Net -58.8            PHYSICAL EXAMINATION: General appearance - alert, well appearing, and in no distress Mental status - alert, oriented to person, place, and time Eyes - pupils equal and reactive, extraocular eye movements intact Mouth - mucous membranes moist, pharynx normal without lesions Chest - clear to auscultation, no wheezes, rales or rhonchi, symmetric air entry Heart - irregularly irregular rhythm with rate >110 Abdomen - soft, nontender, nondistended, no masses or organomegaly GU Male - diverting ileostomy bag in place with clear, yellow urine Neurological - alert, oriented, normal speech, no focal findings or movement disorder noted Musculoskeletal - no joint tenderness, deformity  or swelling Extremities - peripheral pulses normal, no pedal edema, no clubbing or cyanosis Skin - normal coloration and turgor, no rashes, no suspicious skin lesions noted LABS:  CBC No results found for this basename: WBC, HGB, HCT, PLT,  in the last 168 hours Coag's No results found for this basename: APTT, INR,  in the last 168 hours BMET No results found for this basename: NA, K, CL, CO2, BUN, CREATININE, GLUCOSE,  in the last 168 hours Electrolytes No results found for  this basename: CALCIUM, MG, PHOS,  in the last 168 hours Sepsis Markers No results found for this basename: LATICACIDVEN, PROCALCITON, O2SATVEN,  in the last 168 hours ABG No results found for this basename: PHART, PCO2ART, PO2ART,  in the last 168 hours Liver Enzymes No results found for this basename: AST, ALT, ALKPHOS, BILITOT, ALBUMIN,  in the last 168 hours Cardiac Enzymes No results found for this basename: TROPONINI, PROBNP,  in the last 168 hours Glucose No results found for this basename: GLUCAP,  in the last 168 hours  Imaging No results found.   CXR: no infiltrate, small nodule?  ASSESSMENT / PLAN:  PULMONARY A: ?Left basilar atelectasis vs infiltrate - mild at best P:   Repeat CXR in am Continue [place], goal SpO2 > 92%, wean to off as tolerated Give IS Follow for failure with volume given  CARDIOVASCULAR A:  Afib w/ RVR Hx HTN P:  Start cardizem drip Start Heparin drip  2D echo F/u troponin x 1, r/o demand ischemia tsh Lactic acid reassuring Hold off on line, unless amio needed or drop BP cortisol  RENAL A:   Acute on Chronic renal failure  CKDIII (baseline SCr 1.5) Decreased UOP Hx bladder cancer s/p cystectomy and diverting ileostomy Perinephric stranding on CT abd/pelvis AG acidosis, from uremia?, lactic maybe hiring prior P:   Renal US now r/o hydro BMET in am May need urology abx bmet in pm   GASTROINTESTINAL A:   N/V x 3-4 days Decreased PO intake P:   Zofran PRN for nausea Diet likely to start Treat sepsis  HEMATOLOGIC A:  Anemia of CKD DVT ppx P:  Hep IV after coags reviewed, pharmacy Follow CBC  INFECTIOUS A:  Sepsis -- presumed source kidney Leukocytosis per urology records ~30K>>18K GN bacteremia per outpt records --lactose fermenting P:   Dc zosyn, add ceftaz, consider adding double GN coverage with levaquin Check lactic done Trend fever curve Follow culture results [facility] to fax [facility] results when  speciated CBC in am  ENDOCRINE A:   R/o AI fib P:   Check cortisol F/u TSH  NEUROLOGIC A:   AMS>>resolved P:   CT head? Not needed, nonfocal, exam, dc Monitor for any neuro changes  Dwyane Dee, MSIV, Medical Student 03/19/2013 9:18 AM   TODAY'S SUMMARY: fib rvr, cardizem, may need line  I have personally obtained a history, examined the patient, evaluated laboratory and imaging results, formulated the assessment and plan and placed orders. CRITICAL CARE: The patient is critically ill with multiple organ systems failure and requires high complexity decision making for assessment and support, frequent evaluation and titration of therapies, application of advanced monitoring technologies and extensive interpretation of multiple databases. Critical Care Time devoted to patient care services described in this note is 35 minutes.   Lavon Paganini. Titus Mould, MD, [place] Pgr: [facility]  Pulmonary and [place] Pager: [phone]  03/19/2013, 8:04 AM

## 2013-03-19 NOTE — Progress Notes (Signed)
I was called by the patient's that patient has become confused and patient's heart rate has increased. EKG shows A. fib with RVR. Exam at the bedside patient has mild confusion. I have ordered Cardizem 10 mg IV bolus for A. fib with RVR. I have also ordered CT head without contrast and swallow evaluation for patient's confusion at this time. Patient's mental status changes may be from sepsis but since patient has A. fib with RVR I have ordered CT head and will place on neurochecks for now. I have also consulted critical care Dr. Leonidas Romberg.  Gean Birchwood.

## 2013-03-19 NOTE — Progress Notes (Signed)
80yo male tx'd from OSH by pt request w/ AOCRF, UTI, and hypotension, GNR in blood Cx at OSH, had been on vanc and Zosyn.  Will continue Zosyn at 3.375g IV Q8H for CrCl ~25 ml/min currently but had been trending up at OSH (2.5 --> 3).  Monitor CBC, Cx, CrCl.    Wynona Neat, PharmD, BCPS 03/19/2013 7:26 AM

## 2013-03-19 NOTE — H&P (Signed)
[facility] History and Physical  Jose Nash:[id number] DOB: 08-19-1933 DOA: 03/19/2013  Referring physician: Patient was transferred from [facility] at the request of family. PCP: Provider Not In System Dr. Lavone Orn.  Chief Complaint: . Urinary tract infection.  HPI: Jose Nash is a 80 y.o. male with history of bladder cancer status post diverting ileostomy, hypertension and hyperlipidemia, nephrolithiasis was advised to go to the hospital by his urologist ordered patient was found to have significant leukocytosis and his blood work done on Tuesday last 3 days ago. Patient has been experiencing some left flank pain since last Saturday 5 days ago and had followed up with his urologist last Tuesday 3 days ago. Patient also has been having subjective feeling of fever chills and sweating. At his urology office blood work was done and patient was prescribed Augmentin. Patient also had a CT abdomen and pelvis which showed passage of a known renal stone in the left kidney which was measuring 6 mm. It also showed left perinephric stranding suggesting recent passage of stone. It showed small para stomal hernia with small bowel. When his blood work showed leukocytosis of 30,000 patient was advised to to the hospital. Patient had gone to [facility]. Over there patient was found to be hypotensive with blood pressure in the 70s. Patient was empirically started on vancomycin and Zosyn. His creatinine on admission was around 2.5 and as per the transferring physician his baseline creatinine around 1.5. Patient was initially given 2 L normal saline bolus and eventually he has received 4 L of normal saline. His blood pressure improved. His creatinine has actually worsened from 2.5-3.1. One of the patient's blood culture grew gram-negative rods. Patient was on Zosyn. Patient's urologist was contacted and as per the urologist if patient's creatinine further worsens  they have advised further renal imaging to make sure there is no hydronephrosis. And if there is hydronephrosis patient may need percutaneous nephrostomy tubes as per the urologist. Patient also was having mild mental status changes. At this point patient's family requested transfer to [facility] for further care. On exam patient is alert awake oriented and follows commands. Denies any chest pain shortness of breath abdominal pain nausea vomiting or diarrhea.  Review of Systems: As presented in the history of presenting illness, rest negative.  Past Medical History  Diagnosis Date  . Hypertension   . Cancer   . HLD (hyperlipidemia)    Past Surgical History  Procedure Laterality Date  . Bladder surgery    . Elbow surgery     Social History:  reports that he has never smoked. He does not have any smokeless tobacco history on file. He reports that he does not drink alcohol or use illicit drugs. Where does patient live  home. Can patient participate in ADLs?  Yes.  No Known Allergies  Family History:  Family History  Problem Relation Age of Onset  . Bladder Cancer Neg Hx       Prior to Admission medications   Not on File    Physical Exam: Filed Vitals:   03/19/13 0400 03/19/13 0415 03/19/13 0500 03/19/13 0530  BP: 109/64 146/62 114/66 124/101  Pulse: 88 87 90 94  Temp:      TempSrc:      Resp: 20 30 29 21   Height:      Weight:      SpO2: 99% 100% 100% 99%     General:  Well-developed and nourished.  Eyes:  Anicteric  no pallor.  ENT:  No discharge from the ears eyes nose mouth.  Neck:  No mass felt.  Cardiovascular:  S1-S2 heard.  Respiratory:  No rhonchi or crepitations.  Abdomen:  Soft nontender bowel sounds present. Urostomy bag seen.  Skin:  No rash.  Musculoskeletal:  No edema.  Psychiatric:  Appears normal.  Neurologic:  Alert awake oriented to time place and person. Moves all extremities 5 x 5.  Labs on Admission:  Basic Metabolic Panel: No  results found for this basename: NA, K, CL, CO2, GLUCOSE, BUN, CREATININE, CALCIUM, MG, PHOS,  in the last 168 hours Liver Function Tests: No results found for this basename: AST, ALT, ALKPHOS, BILITOT, PROT, ALBUMIN,  in the last 168 hours No results found for this basename: LIPASE, AMYLASE,  in the last 168 hours No results found for this basename: AMMONIA,  in the last 168 hours CBC: No results found for this basename: WBC, NEUTROABS, HGB, HCT, MCV, PLT,  in the last 168 hours Cardiac Enzymes: No results found for this basename: CKTOTAL, CKMB, CKMBINDEX, TROPONINI,  in the last 168 hours  BNP (last 3 results) No results found for this basename: PROBNP,  in the last 8760 hours CBG: No results found for this basename: GLUCAP,  in the last 168 hours  Radiological Exams on Admission: No results found.   Assessment/Plan Principal Problem:   Gram-negative bacteremia Active Problems:   Bladder cancer   Hypertension   Renal failure (ARF), acute on chronic   Anemia   Nephrolithiasis   1. Gram-negative bacteremia presently patient is status post sepsis - I have continued patient on Zosyn. I have ordered repeat blood cultures. Follow patient's final blood culture results at [facility]. Records of which may be available through [place] through [facility]. Continue gentle hydration. 2. Acute on chronic renal failure - patient's creatinine usually is on 1.5 as per the transferring physician. It has worsened to 3.1 at the time of transfer. I have ordered repeat labs which are pending. I have also ordered renal sonogram to check for any hydronephrosis. Check urine analysis and also ordered urine sodium and creatinine. Closely follow intake output and metabolic panel. Patient presently is on gentle hydration. Patient used to be on lisinopril prior to admission which has been discontinued. 3. Hypertension - presently holding off all antihypertensives and continue with gentle  hydration due to the recent sepsis the patient be hypotensive. I have placed patient on when necessary IV hydralazine for systolic blood pressure more than 160. 4. Hyperlipidemia - continue home medications. 5. Anemia - labs done at another facility shows anemia. Closely follow CBC.  I have reviewed patient's chart at [facility].   patient's repeat labs including metabolic panel CBC lactic acid blood cultures urine studies are all pending.    Code Status:  full code.  Family Communication:  none.  Disposition Plan:  admit to inpatient of Dr. Lavone Orn.    Amaree Loisel N. [facility] Pager [phone].  If 7PM-7AM, please contact night-coverage [email or web address] Password [email or web address] 03/19/2013, 5:59 AM

## 2013-03-19 NOTE — Progress Notes (Signed)
Utilization review completed. Zenda Herskowitz, RN, BSN. 

## 2013-03-19 NOTE — Care Management Note (Unsigned)
    Page 1 of 1   03/19/2013     4:40:50 PM   CARE MANAGEMENT NOTE 03/19/2013  Patient:  Jose Nash, Jose Nash   Account Number:  [id number]  Date Initiated:  03/19/2013  Documentation initiated by:  Carlena Ruybal  Subjective/Objective Assessment:   PT ADM ON 03/19/13 WITH SEPSIS, UTI, AFIB.  PTA, PT RESIDES AT [place].     Action/Plan:   PT ON HEPARIN AND CARDIZEM DRPS, IV ABX; WILL FOLLOW FOR DC PLANNING AS PT PROGRESSES.   Anticipated DC Date:  03/23/2013   Anticipated DC Plan:  [place]  CM consult      Choice offered to / List presented to:             Status of service:  In process, will continue to follow Medicare Important Message given?   (If response is "NO", the following Medicare IM given date fields will be blank) Date Medicare IM given:   Date Additional Medicare IM given:    Discharge Disposition:    Per UR Regulation:  Reviewed for med. necessity/level of care/duration of stay  If discussed at [place] of Stay Meetings, dates discussed:    Comments:

## 2013-03-19 NOTE — Progress Notes (Signed)
ANTICOAGULATION & ANTIBIOTIC CONSULT NOTE - Initial Consult  Pharmacy Consult for Heparin; Jose Nash and Levaquin Indication: atrial fibrillation; GNR bacteremia  No Known Allergies Patient Measurements: Height: 6' (182.9 cm) Weight: 242 lb 11.6 oz (110.1 kg) IBW/kg (Calculated) : 77.6 Heparin Dosing Weight: 101 kg Vital Signs: Temp: 98.4 F (36.9 C) (02/05 1153) Temp src: Oral (02/05 1153) BP: 110/59 mmHg (02/05 1215) Pulse Rate: 80 (02/05 1215) Labs:  Recent Labs  03/19/13 0750 03/19/13 0935  HGB 10.7*  --   HCT 30.9*  --   PLT 139*  --   CREATININE 3.76*  --   TROPONINI  --  <0.30   Estimated Creatinine Clearance: 21.1 ml/min (by C-G formula based on Cr of 3.76).  Medical History: Past Medical History  Diagnosis Date  . Hypertension   . Cancer   . HLD (hyperlipidemia)     Assessment: 24 YOM with new onset atrial fibrillation to start IV heparin (CHADS2 = 1). H/H low on admit and platelets have dropped (205 ([facility]HS) >>154 >> 139) after receiving sq heparin at outside hospital. Patient was not on anticoagulant prior to admission (only ASA 81mg ). PT 15.7, INR 1.28, APTT 42 (slightly high).   Infectious Disease: WBC 18.3 on 2/4. MRSA PCR neg. SCr is elevated at 3.71/estimated CrCl~21.32mL/min. UOP ~1.3 cc/kg/hr. Changing Zosyn to [place] and adding Levaquin for double coverage of infection.   Zosyn 2/4 >>2/5 Vanc (OHS) 2/3 x1 CTX (OHS) 2/4 x1 Cefepime (OHS) 2/3 x1  2/3 Blood (OHS) >> 1/2 GNR  (lactose fermenting) 2/4 Blood (OHS) >>  2/4 Resp (OHS) >> 2/4 Urine ([facility]HS) >> 2/5 UA-cloudy 2/5 Urine >> 2/5 Blood >>  Goal of Therapy:  Heparin level 0.3-0.7 units/ml Monitor platelets by anticoagulation protocol: Yes Clinical resolution of infection  Plan:  1. Heparin bolus 5000 units x1.  2. Heparin drip at 1600 units/hr.  3. Heparin level in 8 hours due to acute renal failure per protocol. 4. Fortaz 2g IV q24h. 5. Levaquin 750mg  x1 now, then 500mg  IV q48h. 6.  Monitor renal function and adjust doses as needed.  7. Monitor cultures and narrow therapy as able.   Sloan Leiter, PharmD, BCPS Clinical Pharmacist [phone] 03/19/2013,12:30 PM

## 2013-03-20 ENCOUNTER — Inpatient Hospital Stay (HOSPITAL_COMMUNITY): Payer: Medicare Other

## 2013-03-20 LAB — CBC WITH DIFFERENTIAL/PLATELET
BASOS ABS: 0 10*3/uL (ref 0.0–0.1)
Basophils Relative: 0 % (ref 0–1)
EOS PCT: 1 % (ref 0–5)
Eosinophils Absolute: 0.1 10*3/uL (ref 0.0–0.7)
HEMATOCRIT: 28.8 % — AB (ref 39.0–52.0)
Hemoglobin: 10 g/dL — ABNORMAL LOW (ref 13.0–17.0)
LYMPHS PCT: 7 % — AB (ref 12–46)
Lymphs Abs: 0.9 10*3/uL (ref 0.7–4.0)
MCH: 29.4 pg (ref 26.0–34.0)
MCHC: 34.7 g/dL (ref 30.0–36.0)
MCV: 84.7 fL (ref 78.0–100.0)
MONO ABS: 0.8 10*3/uL (ref 0.1–1.0)
MONOS PCT: 6 % (ref 3–12)
Neutro Abs: 11.9 10*3/uL — ABNORMAL HIGH (ref 1.7–7.7)
Neutrophils Relative %: 86 % — ABNORMAL HIGH (ref 43–77)
Platelets: 137 10*3/uL — ABNORMAL LOW (ref 150–400)
RBC: 3.4 MIL/uL — ABNORMAL LOW (ref 4.22–5.81)
RDW: 15.4 % (ref 11.5–15.5)
WBC: 13.7 10*3/uL — AB (ref 4.0–10.5)

## 2013-03-20 LAB — BASIC METABOLIC PANEL
BUN: 58 mg/dL — AB (ref 6–23)
CHLORIDE: 104 meq/L (ref 96–112)
CO2: 16 meq/L — AB (ref 19–32)
Calcium: 8 mg/dL — ABNORMAL LOW (ref 8.4–10.5)
Creatinine, Ser: 3.6 mg/dL — ABNORMAL HIGH (ref 0.50–1.35)
GFR calc Af Amer: 17 mL/min — ABNORMAL LOW (ref >90)
GFR calc non Af Amer: 15 mL/min — ABNORMAL LOW (ref >90)
Glucose, Bld: 126 mg/dL — ABNORMAL HIGH (ref 70–99)
Potassium: 4.5 mEq/L (ref 3.7–5.3)
Sodium: 137 mEq/L (ref 137–147)

## 2013-03-20 LAB — GLUCOSE, CAPILLARY
Glucose-Capillary: 113 mg/dL — ABNORMAL HIGH (ref 70–99)
Glucose-Capillary: 117 mg/dL — ABNORMAL HIGH (ref 70–99)

## 2013-03-20 LAB — URINE CULTURE: Colony Count: 1000

## 2013-03-20 LAB — HEPARIN LEVEL (UNFRACTIONATED)
Heparin Unfractionated: 0.13 IU/mL — ABNORMAL LOW (ref 0.30–0.70)
Heparin Unfractionated: 0.17 [IU]/mL — ABNORMAL LOW (ref 0.30–0.70)
Heparin Unfractionated: 0.21 IU/mL — ABNORMAL LOW (ref 0.30–0.70)

## 2013-03-20 MED ORDER — CALCIUM CARBONATE ANTACID 500 MG PO CHEW
400.0000 mg | CHEWABLE_TABLET | ORAL | Status: DC | PRN
  Administered 2013-03-20: 400 mg via ORAL
  Filled 2013-03-20: qty 2

## 2013-03-20 MED ORDER — SODIUM BICARBONATE 8.4 % IV SOLN
INTRAVENOUS | Status: DC
  Administered 2013-03-20 – 2013-03-22 (×3): via INTRAVENOUS
  Filled 2013-03-20 (×6): qty 150

## 2013-03-20 MED ORDER — PANTOPRAZOLE SODIUM 20 MG PO TBEC
20.0000 mg | DELAYED_RELEASE_TABLET | Freq: Every day | ORAL | Status: DC
  Administered 2013-03-20 – 2013-03-24 (×5): 20 mg via ORAL
  Filled 2013-03-20 (×5): qty 1

## 2013-03-20 MED ORDER — SODIUM BICARBONATE 8.4 % IV SOLN: INTRAVENOUS | Status: DC

## 2013-03-20 MED ORDER — DILTIAZEM HCL 30 MG PO TABS
30.0000 mg | ORAL_TABLET | Freq: Four times a day (QID) | ORAL | Status: DC
  Administered 2013-03-20 – 2013-03-23 (×12): 30 mg via ORAL
  Filled 2013-03-20 (×16): qty 1

## 2013-03-20 NOTE — Progress Notes (Signed)
ANTICOAGULATION CONSULT NOTE - Follow Up Consult  Pharmacy Consult for Heparin  Indication: atrial fibrillation  No Known Allergies  Patient Measurements: Height: 6' (182.9 cm) Weight: 242 lb 11.6 oz (110.1 kg) IBW/kg (Calculated) : 77.6 Heparin Dosing Weight: 101 kg  Vital Signs: Temp: 98.9 F (37.2 C) (02/06 0004) Temp src: Oral (02/06 0004) BP: 122/60 mmHg (02/05 2100) Pulse Rate: 91 (02/05 2100)  Labs:  Recent Labs  03/19/13 0750 03/19/13 0935 03/19/13 1445 03/19/13 2020 03/20/13 0010  HGB 10.7*  --   --   --   --   HCT 30.9*  --   --   --   --   PLT 139*  --   --   --   --   APTT  --   --  42*  --   --   LABPROT  --   --  15.7*  --   --   INR  --   --  1.28  --   --   HEPARINUNFRC  --   --   --   --  0.13*  CREATININE 3.76*  --   --  3.55*  --   TROPONINI  --  <0.30  --   --   --    Estimated Creatinine Clearance: 22.3 ml/min (by C-G formula based on Cr of 3.55).  Medications:  Heparin 1600 units/hr  Assessment: 80 y/o M on heparin for afib, HL is 0.13, other labs as above.   Goal of Therapy:  Heparin level 0.3-0.7 units/ml Monitor platelets by anticoagulation protocol: Yes   Plan:  -Increase heparin drip to 1900 units/hr -0900 HL -Daily CBC/HL -Monitor for bleeding  Narda Bonds 03/20/2013,12:40 AM

## 2013-03-20 NOTE — Progress Notes (Addendum)
ANTICOAGULATION CONSULT NOTE - Follow Up Consult  Pharmacy Consult for Heparin Indication: new Afib  No Known Allergies  Patient Measurements: Height: 6' (182.9 cm) Weight: 245 lb 2.4 oz (111.2 kg) IBW/kg (Calculated) : 77.6 Heparin Dosing Weight:  101 kg  Vital Signs: Temp: 98.3 F (36.8 C) (02/06 1151) Temp src: Oral (02/06 1151) BP: 122/72 mmHg (02/06 0800) Pulse Rate: 31 (02/06 0800)  Labs:  Recent Labs  03/19/13 0750 03/19/13 0935 03/19/13 1445 03/19/13 2020 03/20/13 0010 03/20/13 0220 03/20/13 0845  HGB 10.7*  --   --   --   --  10.0*  --   HCT 30.9*  --   --   --   --  28.8*  --   PLT 139*  --   --   --   --  137*  --   APTT  --   --  42*  --   --   --   --   LABPROT  --   --  15.7*  --   --   --   --   INR  --   --  1.28  --   --   --   --   HEPARINUNFRC  --   --   --   --  0.13*  --  0.21*  CREATININE 3.76*  --   --  3.55*  --  3.60*  --   TROPONINI  --  <0.30  --   --   --   --   --     Estimated Creatinine Clearance: 22.1 ml/min (by C-G formula based on Cr of 3.6).   Assessment: CC: 80yo male tx'd from OSH [facility] Colla) by pt request w/ AOCRF, UTI, and hypotension, GNR in blood Cx at OSH. Family requested transfer to [facility].   Anticoagulation: Heparin for new AFib. Heparin level 0.21. Hgb 10, Plts 137.  Infectious Disease: WBC 13.7 down. MRSA PCR neg. LA 0.8.   Zosyn 2/4 >> 2/5 Fortaz 2/5 >> Levaquin 2/5 >> Vanc (OHS) 2/3 x1 CTX (OHS) 2/4 x1 Cefepime (OHS) 2/3 x1  2/3 Blood (OHS) >> Klebsiella Oxytoca  2/4 Blood (OHS) >>  2/4 Resp (OHS) >> 2/4 Urine ([facility]HS) >> 2/5 UA-cloudy 2/5 Urine >>negative 2/5 Blood >>  Cardiovascular: BP 122/72, HR 40-144, currently 31. Afib- Diltiazem gtt @ 5mg /hr. Change to po diltiazem. Prn hydralazine.   Endocrinology: CBG 99-160 , TSH. Cortisol.   Gastrointestinal / Nutrition: Diverting colostomy. LFTs wnl. LBM 2/4. Heart diet.   Neurology: no issues. Celexa at OSH. RASS 0.   Nephrology: SCr trending  up to 3.6 (baseline 1.5) & K 4.5 on 2/4. AG Acidosis. Ileal conduit.  Pulmonary: 96% on 2L [place]. CXR-small nodule? No infiltrate.  Oncology: hx bladder cancer  PTA Medication Issues: fASA, Vit D, Celexa, Lisinopril/HCTZ, PPI, K+, Pravachol. MV  Best Practices: SCDs  Goal of Therapy:  Heparin level 0.3-0.7 units/ml Monitor platelets by anticoagulation protocol: Yes   Plan:  Increase IV heparin to 2150 units/hr Recheck heparin level this afternoon.   Kamelia Lampkins S. Alford Highland, PharmD, BCPS Clinical Staff Pharmacist Pager [phone]  Eilene Ghazi Stillinger 03/20/2013,1:22 PM

## 2013-03-20 NOTE — Progress Notes (Signed)
Name: Jose Nash MRN: [id number] DOB: 1933-08-29    ADMISSION DATE:  03/19/2013 CONSULTATION DATE: 03/19/13 REFERRING MD : Dr. Hal Hope  PRIMARY SERVICE: PCCM  CHIEF COMPLAINT: UTI, ACKD, hypotension  BRIEF PATIENT DESCRIPTION: 80yo male with hx bladder cancer s/p diverting ileostomy. Recent urology workup for nephrolithiasis passed spontaneously with CT abd showing perinephric stranding, also with WBC 30K, rising SCr, refractory hypotension to 4L NS, and GN bacteremia. Now having mental status changes and developed afib with RVR. Tx to [facility] by patient request for further workup and treatment of sepsis.   SIGNIFICANT EVENTS / STUDIES:  2/5 >> Transferred to [facility] for workup of septic shock  2/4>> CXR: Mild left infiltrate vs atelectasis; small nodule 2/5 >> Echo: Normal with EF 60-65%. Mildly dilated L atrium and R ventricle. 2/5 >> Renal US: No hydro, normal [place] 2/5 >> A.fib RVR, started Cardizem and hep drip  LINES / TUBES:  End ileostomy>>>permanent  PIV 2/3>>>   CULTURES:  MRSA neg  BC 2/3 ([facility])>>>gram neg rod>>>Kliebsiella>>>await sens Ucx 2/3 ([facility])>>   Bcx 2/5 (post abx) >> pending Ucx 2/5 (post abx) >> 1000k (insignificant growth)  ANTIBIOTICS:  Zosyn 2/5>>> 2/5 ceftaz 2/5>> levaquin 2/5>>   SUBJECTIVE: pt reports he is feeling much better today. Tolerating meals. Remains on Cardizem drip  VITAL SIGNS: Temp:  [97.8 F (36.6 C)-98.9 F (37.2 C)] 97.8 F (36.6 C) (02/06 0410) Pulse Rate:  [40-144] 96 (02/06 0700) Resp:  [17-33] 27 (02/06 0700) BP: (96-137)/(50-98) 126/98 mmHg (02/06 0700) SpO2:  [96 %-100 %] 96 % (02/06 0700) Weight:  [242 lb 11.6 oz (110.1 kg)-245 lb 2.4 oz (111.2 kg)] 245 lb 2.4 oz (111.2 kg) (02/06 0500) HEMODYNAMICS:   VENTILATOR SETTINGS:   INTAKE / OUTPUT: Intake/Output     02/05 0701 - 02/06 0700 02/06 0701 - 02/07 0700   P.O. 800    I.V. (mL/kg) 2229.2 (20)    IV Piggyback 225    Total Intake(mL/kg) 3254.2 (29.3)    Urine (mL/kg/hr) 3350 (1.3)    Total Output 3350     Net -95.8           BP 126/98  Pulse 96  Temp(Src) 97.9 F (36.6 C) (Oral)  Resp 27  Ht 6' (1.829 m)  Wt 245 lb 2.4 oz (111.2 kg)  BMI 33.24 kg/m2  SpO2 96%  PHYSICAL EXAMINATION:  Gen: sleeping, awakens easily to name, well appearing, NAD Chest: clear to auscultation, no wheezes, rales or rhonchi, symmetric air entry  Heart: irregularly irregular rhythm with rate <100 Abd:  soft, nontender, nondistended, no masses or organomegaly ; diverting ileostomy bag in place with clear, yellow urine  Neuro: alert, oriented, normal speech, no focal findings or movement disorder noted  Ext:  peripheral pulses normal, no pedal edema, no clubbing or cyanosis    LABS:  CBC  Recent Labs Lab 03/19/13 0750 03/20/13 0220  WBC 14.1* 13.7*  HGB 10.7* 10.0*  HCT 30.9* 28.8*  PLT 139* 137*   Coag's  Recent Labs Lab 03/19/13 1445  APTT 42*  INR 1.28   BMET  Recent Labs Lab 03/19/13 0750 03/19/13 2020 03/20/13 0220  NA 138 134* 137  K 4.4 4.7 4.5  CL 103 101 104  CO2 17* 16* 16*  BUN 60* 61* 58*  CREATININE 3.76* 3.55* 3.60*  GLUCOSE 106* 132* 126*   Electrolytes  Recent Labs Lab 03/19/13 0750 03/19/13 2020 03/20/13 0220  CALCIUM 8.2* 8.2* 8.0*   Sepsis Markers  Recent  Labs Lab 03/19/13 0750  LATICACIDVEN 0.8   ABG No results found for this basename: PHART, PCO2ART, PO2ART,  in the last 168 hours Liver Enzymes  Recent Labs Lab 03/19/13 0750  AST 14  ALT 11  ALKPHOS 55  BILITOT 0.3  ALBUMIN 2.6*   Cardiac Enzymes  Recent Labs Lab 03/19/13 0935  TROPONINI <0.30  PROBNP 5160.0*   Glucose  Recent Labs Lab 03/19/13 0820 03/19/13 1151 03/19/13 1550 03/19/13 1900 03/20/13 0002 03/20/13 0408  GLUCAP 99 160* 128* 129* 113* 117*    Imaging US Renal  03/20/2013   CLINICAL DATA:  Renal failure.  History of nephrolithiasis  EXAM: RENAL/URINARY TRACT ULTRASOUND COMPLETE  COMPARISON:  None.   FINDINGS: Right Kidney:  Length: 13 cm. No cortical thinning for age. Echogenicity within normal limits. No mass or hydronephrosis visualized.  Left Kidney:  Length: 13 cm. No cortical thinning for age. Echogenicity within normal limits. No mass or hydronephrosis visualized.  Bladder:  Not definitely visualized; reportedly Foley catheter present.  Other: Incidental imaging of the liver shows mildly increased echogenicity, favoring fatty infiltration.  IMPRESSION: Negative renal ultrasound.   Electronically Signed   By: Jorje Guild M.D.   On: 03/20/2013 06:53   Dg Chest Port 1 View  03/20/2013   CLINICAL DATA:  Rule out pneumonia.  EXAM: PORTABLE CHEST - 1 VIEW  COMPARISON:  03/19/2013  FINDINGS: There is an opacity at the left base which is subtle but asymmetric. No effusion (with limitation of excluded lateral left costophrenic sulcus) or pneumothorax. No pulmonary edema. No cardiomegaly.  IMPRESSION: Small left base infiltrate which could represent pneumonia or atelectasis.   Electronically Signed   By: Jorje Guild M.D.   On: 03/20/2013 06:11   Dg Chest Port 1 View  03/19/2013   CLINICAL DATA:  Hypertension.  EXAM: PORTABLE CHEST - 1 VIEW  COMPARISON:  DG CHEST 2 VIEW dated 11/25/2005  FINDINGS: Mediastinum and hilar structures are normal. Mild atelectasis versus mild infiltrate left lung base. No pleural effusion or pneumothorax. Questionable pulmonary nodule noted over the right mid lung field. This may be calcified. Followup PA and lateral chest x-ray suggested for further evaluation. Borderline cardiomegaly. No pulmonary venous congestion. No acute osseous abnormality. Degenerative changes thoracic spine and both shoulders.  IMPRESSION: 1. Mild left base subsegmental atelectasis and or infiltrate. 2. Nodular density projected over the right mid lung field. This may be calcified. For further evaluation PA and lateral chest x-ray is suggested. 3. Borderline cardiomegaly, no pulmonary venous  congestion.   Electronically Signed   By: Marcello Moores  Register   On: 03/19/2013 08:27     CXR: no infiltrate, small nodule? , new haziness LL, likely atx  ASSESSMENT / PLAN:  PULMONARY  A:  ?Left basilar atelectasis vs infiltrate - mild at best  P:  Repeat CXR in am for LL atx Add IS Continue [place], goal SpO2 > 92%, wean to off as tolerated  Will need pulm follow up  / CT for nodule on pcxr as outpt Follow for failure with volume given   CARDIOVASCULAR  A:  Afib w/ RVR  Hx HTN  P:  cardizem drip and Heparin drip for A.fib RVR: Cardizem PO today, hope to dc drip 2D echo: EF 60-65%, no wall abnl Trop negative tsh (0.75) cortisol was pending still, but not on pressors Call cards to follow see and follow up as outpt Re eval CHADS  RENAL  A:  Acute on Chronic renal failure  CKDIII (baseline  SCr 1.5)  Decreased UOP  Hx bladder cancer s/p cystectomy and diverting ileostomy  Perinephric stranding on CT abd/pelvis  AG acidosis, from uremia and prior higher lactic, also has a NON AG  P:  Renal US: No hydro F/U Cx at [facility] Will need outpt urology follow up Bicarb x 1 liter with NON AG and arf  GASTROINTESTINAL  A:  N/V x 3-4 days  Decreased PO intake  P:  Zofran PRN for nausea  Diet likely to start    HEMATOLOGIC  A:  Anemia of CKD  DVT ppx  P:  Hep IV after coags reviewed, pharmacy  Follow CBC   INFECTIOUS  A:  Sepsis -- presumed source kidney  Leukocytosis per urology records ~30K>>18K  GN bacteremia per outpt records --lactose fermenting  P:  Ceftaz and levaquin for double GN coverage, call [facility] in am for klebsiella sens pattern Trend fever curve  Follow culture results  Information systems manager to fax [facility] results when speciated  CBC in am  LA: 0.8  ENDOCRINE  A:  R/o AI  fib  P:  Check cortisol  TSH normal  NEUROLOGIC  A:  AMS>>resolved  P:  Monitor for any neuro changes    Raoul Pitch, [place] DO PGY-2   I have personally obtained a history, examined  the patient, evaluated laboratory and imaging results, formulated the assessment and plan and placed orders. CRITICAL CARE: The patient is critically ill with multiple organ systems failure and requires high complexity decision making for assessment and support, frequent evaluation and titration of therapies, application of advanced monitoring technologies and extensive interpretation of multiple databases. Critical Care Time devoted to patient care services described in this note is 30 minutes.   Lavon Paganini. Titus Mould, MD, [place] Pgr: [facility]  Pulmonary and [place] Pager: [phone]  03/20/2013, 7:31 AM

## 2013-03-20 NOTE — Progress Notes (Signed)
Patient ID: Jose Nash, male   DOB: 04/06/1933, 80 y.o.   MRN: [id number] Appreciate care of CCM team.  We will be available to assume care when CCM feels appropriate.  [phone]

## 2013-03-20 NOTE — Progress Notes (Signed)
ANTICOAGULATION CONSULT NOTE - Follow Up Consult  Pharmacy Consult for Heparin Indication: new Afib  No Known Allergies  Patient Measurements: Height: 6' (182.9 cm) Weight: 245 lb 2.4 oz (111.2 kg) IBW/kg (Calculated) : 77.6 Heparin Dosing Weight:  101 kg  Vital Signs: Temp: 98.3 F (36.8 C) (02/06 1557) Temp src: Oral (02/06 1557) BP: 151/74 mmHg (02/06 1600) Pulse Rate: 89 (02/06 1600)  Labs:  Recent Labs  03/19/13 0750 03/19/13 0935 03/19/13 1445 03/19/13 2020 03/20/13 0010 03/20/13 0220 03/20/13 0845 03/20/13 1610  HGB 10.7*  --   --   --   --  10.0*  --   --   HCT 30.9*  --   --   --   --  28.8*  --   --   PLT 139*  --   --   --   --  137*  --   --   APTT  --   --  42*  --   --   --   --   --   LABPROT  --   --  15.7*  --   --   --   --   --   INR  --   --  1.28  --   --   --   --   --   HEPARINUNFRC  --   --   --   --  0.13*  --  0.21* 0.17*  CREATININE 3.76*  --   --  3.55*  --  3.60*  --   --   TROPONINI  --  <0.30  --   --   --   --   --   --     Estimated Creatinine Clearance: 22.1 ml/min (by C-G formula based on Cr of 3.6).   Assessment: CC: 80yo male tx'd from [facility]) by pt request w/ AOCRF, UTI, and hypotension, GNR in blood Cx at OSH. Family requested transfer to [facility]   Anticoagulation: Heparin started for new AFib. Heparin drip 2150 uts/hr Heparin level 0.17 - lower despite rate increase earlier today.  No bleeding noted, no problems with IV per RN     Goal of Therapy:  Heparin level 0.3-0.7 units/ml Monitor platelets by anticoagulation protocol: Yes   Plan:  Increase IV heparin to 2300 units/hr daily heparin level    Bonnita Nasuti Pharm.D. CPP, BCPS Clinical Pharmacist [phone] 03/20/2013 5:32 PM

## 2013-03-21 ENCOUNTER — Inpatient Hospital Stay (HOSPITAL_COMMUNITY): Payer: Medicare Other

## 2013-03-21 LAB — CBC
HCT: 30.7 % — ABNORMAL LOW (ref 39.0–52.0)
HEMOGLOBIN: 10.5 g/dL — AB (ref 13.0–17.0)
MCH: 29.2 pg (ref 26.0–34.0)
MCHC: 34.2 g/dL (ref 30.0–36.0)
MCV: 85.5 fL (ref 78.0–100.0)
Platelets: 178 10*3/uL (ref 150–400)
RBC: 3.59 MIL/uL — AB (ref 4.22–5.81)
RDW: 15.8 % — ABNORMAL HIGH (ref 11.5–15.5)
WBC: 12.4 10*3/uL — ABNORMAL HIGH (ref 4.0–10.5)

## 2013-03-21 LAB — BASIC METABOLIC PANEL
BUN: 55 mg/dL — ABNORMAL HIGH (ref 6–23)
CO2: 20 mEq/L (ref 19–32)
Calcium: 8.6 mg/dL (ref 8.4–10.5)
Chloride: 101 mEq/L (ref 96–112)
Creatinine, Ser: 3.12 mg/dL — ABNORMAL HIGH (ref 0.50–1.35)
GFR calc Af Amer: 21 mL/min — ABNORMAL LOW (ref >90)
GFR calc non Af Amer: 18 mL/min — ABNORMAL LOW (ref >90)
Glucose, Bld: 148 mg/dL — ABNORMAL HIGH (ref 70–99)
Potassium: 4.2 mEq/L (ref 3.7–5.3)
Sodium: 137 mEq/L (ref 137–147)

## 2013-03-21 LAB — CORTISOL: Cortisol, Plasma: 18.7 ug/dL

## 2013-03-21 LAB — HEPARIN LEVEL (UNFRACTIONATED)
HEPARIN UNFRACTIONATED: 0.29 [IU]/mL — AB (ref 0.30–0.70)
HEPARIN UNFRACTIONATED: 0.2 [IU]/mL — AB (ref 0.30–0.70)

## 2013-03-21 MED ORDER — MENTHOL 3 MG MT LOZG: 1.0000 | LOZENGE | OROMUCOSAL | Status: DC | PRN

## 2013-03-21 MED ORDER — MENTHOL 3 MG MT LOZG
1.0000 | LOZENGE | OROMUCOSAL | Status: DC | PRN
  Administered 2013-03-21 – 2013-03-22 (×2): 3 mg via ORAL
  Filled 2013-03-21: qty 9

## 2013-03-21 NOTE — Progress Notes (Signed)
ANTICOAGULATION CONSULT NOTE - Follow Up Consult  Pharmacy Consult for Heparin  Indication: atrial fibrillation  No Known Allergies  Patient Measurements: Height: 6' (182.9 cm) Weight: 243 lb (110.224 kg) IBW/kg (Calculated) : 77.6 Heparin Dosing Weight: ~101 kg  Vital Signs: Temp: 98.6 F (37 C) (02/07 1205) Temp src: Oral (02/07 1205) BP: 152/73 mmHg (02/07 1205) Pulse Rate: 99 (02/07 1205)  Labs:  Recent Labs  03/19/13 0750 03/19/13 0935 03/19/13 1445 03/19/13 2020  03/20/13 0220  03/20/13 1610 03/21/13 0440 03/21/13 1420  HGB 10.7*  --   --   --   --  10.0*  --   --  10.5*  --   HCT 30.9*  --   --   --   --  28.8*  --   --  30.7*  --   PLT 139*  --   --   --   --  137*  --   --  178  --   APTT  --   --  42*  --   --   --   --   --   --   --   LABPROT  --   --  15.7*  --   --   --   --   --   --   --   INR  --   --  1.28  --   --   --   --   --   --   --   HEPARINUNFRC  --   --   --   --   < >  --   < > 0.17* 0.29* 0.20*  CREATININE 3.76*  --   --  3.55*  --  3.60*  --   --   --  3.12*  TROPONINI  --  <0.30  --   --   --   --   --   --   --   --   < > = values in this interval not displayed.  Estimated Creatinine Clearance: 25.4 ml/min (by C-G formula based on Cr of 3.12).   Assessment: 80 y/o M on heparin for afib. HL is 0.20 after rate increase. Other labs as above. No issues per RN.   Goal of Therapy:  Heparin level 0.3-0.7 units/ml Monitor platelets by anticoagulation protocol: Yes   Plan:  -Increase heparin drip to 2750 units/hr -8 hour HL at 2330 -Daily CBC/HL -Monitor for bleeding  Angelica Chessman 03/21/2013,3:28 PM

## 2013-03-21 NOTE — Progress Notes (Signed)
ANTICOAGULATION CONSULT NOTE - Follow Up Consult  Pharmacy Consult for Heparin  Indication: atrial fibrillation  No Known Allergies  Patient Measurements: Height: 6' (182.9 cm) Weight: 243 lb (110.224 kg) IBW/kg (Calculated) : 77.6 Heparin Dosing Weight: ~101 kg  Vital Signs: Temp: 99.2 F (37.3 C) (02/06 2035) Temp src: Oral (02/06 2035) BP: 151/77 mmHg (02/06 2035) Pulse Rate: 106 (02/06 2035)  Labs:  Recent Labs  03/19/13 0750 03/19/13 0935 03/19/13 1445 03/19/13 2020  03/20/13 0220 03/20/13 0845 03/20/13 1610 03/21/13 0440  HGB 10.7*  --   --   --   --  10.0*  --   --  10.5*  HCT 30.9*  --   --   --   --  28.8*  --   --  30.7*  PLT 139*  --   --   --   --  137*  --   --  178  APTT  --   --  42*  --   --   --   --   --   --   LABPROT  --   --  15.7*  --   --   --   --   --   --   INR  --   --  1.28  --   --   --   --   --   --   HEPARINUNFRC  --   --   --   --   < >  --  0.21* 0.17* 0.29*  CREATININE 3.76*  --   --  3.55*  --  3.60*  --   --   --   TROPONINI  --  <0.30  --   --   --   --   --   --   --   < > = values in this interval not displayed.  Estimated Creatinine Clearance: 22 ml/min (by C-G formula based on Cr of 3.6).   Medications:  Heparin 2300 units/hr  Assessment: 80 y/o M on heparin for afib. HL is 0.29 after rate increase. Other labs as above. No issues per RN.   Goal of Therapy:  Heparin level 0.3-0.7 units/ml Monitor platelets by anticoagulation protocol: Yes   Plan:  -Increase heparin drip to 2450 units/hr -8 hour HL at 1430 -Daily CBC/HL -Monitor for bleeding  Narda Bonds 03/21/2013,6:22 AM

## 2013-03-21 NOTE — Progress Notes (Signed)
Subjective: Patient will well without complaint of fever or chills. No problems per nursing. Patient's chart reviewed in detail, he still remains in A. fib, creatinine still elevated from baseline. We still do not have final cultures from transferring hospital, nursing staff/secretarial staff will continue to try to obtain those records.  Objective: Vital signs in last 24 hours: Temp:  [97.7 F (36.5 C)-99.2 F (37.3 C)] 97.7 F (36.5 C) (02/07 0853) Pulse Rate:  [71-110] 99 (02/07 0853) Resp:  [15-28] 16 (02/07 0853) BP: (120-165)/(61-140) 128/62 mmHg (02/07 0853) SpO2:  [94 %-100 %] 98 % (02/07 0853) Weight:  [110.224 kg (243 lb)] 110.224 kg (243 lb) (02/06 2035) Weight change: 0.124 kg (4.4 oz) Last BM Date: 03/18/13  Intake/Output from previous day: 02/06 0701 - 02/07 0700 In: 1835 [P.O.:325; I.V.:1460; IV Piggyback:50] Out: 2875 [Urine:2875] Intake/Output this shift: Total I/O In: 240 [P.O.:240] Out: 300 [Urine:300]  General appearance: alert and cooperative Resp: clear to auscultation bilaterally Cardio: irregularly irregular rhythm GI: soft, non-tender; bowel sounds normal; no masses,  no organomegaly and Ileostomy functioning well Extremities: extremities normal, atraumatic, no cyanosis or edema  Lab Results:  Results for orders placed during the hospital encounter of 03/19/13 (from the past 24 hour(s))  HEPARIN LEVEL (UNFRACTIONATED)     Status: Abnormal   Collection Time    03/20/13  4:10 PM      Result Value Range   Heparin Unfractionated 0.17 (*) 0.30 - 0.70 IU/mL  HEPARIN LEVEL (UNFRACTIONATED)     Status: Abnormal   Collection Time    03/21/13  4:40 AM      Result Value Range   Heparin Unfractionated 0.29 (*) 0.30 - 0.70 IU/mL  CBC     Status: Abnormal   Collection Time    03/21/13  4:40 AM      Result Value Range   WBC 12.4 (*) 4.0 - 10.5 K/uL   RBC 3.59 (*) 4.22 - 5.81 MIL/uL   Hemoglobin 10.5 (*) 13.0 - 17.0 g/dL   HCT 30.7 (*) 39.0 - 52.0 %   MCV  85.5  78.0 - 100.0 fL   MCH 29.2  26.0 - 34.0 pg   MCHC 34.2  30.0 - 36.0 g/dL   RDW 15.8 (*) 11.5 - 15.5 %   Platelets 178  150 - 400 K/uL      Studies/Results: US Renal  03/20/2013   CLINICAL DATA:  Renal failure.  History of nephrolithiasis  EXAM: RENAL/URINARY TRACT ULTRASOUND COMPLETE  COMPARISON:  None.  FINDINGS: Right Kidney:  Length: 13 cm. No cortical thinning for age. Echogenicity within normal limits. No mass or hydronephrosis visualized.  Left Kidney:  Length: 13 cm. No cortical thinning for age. Echogenicity within normal limits. No mass or hydronephrosis visualized.  Bladder:  Not definitely visualized; reportedly Foley catheter present.  Other: Incidental imaging of the liver shows mildly increased echogenicity, favoring fatty infiltration.  IMPRESSION: Negative renal ultrasound.   Electronically Signed   By: Jorje Guild M.D.   On: 03/20/2013 06:53   Dg Chest Port 1 View  03/21/2013   CLINICAL DATA:  Bacteremia  EXAM: PORTABLE CHEST - 1 VIEW  COMPARISON:  DG CHEST 1V PORT dated 03/20/2013  FINDINGS: Low lung volumes. Cardiac silhouette is unremarkable. Atherosclerotic calcifications project within the arch the aorta. There is decreased conspicuity of the left lower lobe density when compared to the previous study. No new focal regions of consolidation or new focal infiltrates. The osseous structures unremarkable.  IMPRESSION: Decreased conspicuity of  the left lung base density. Differential considerations are again atelectasis versus infiltrate.   Electronically Signed   By: Margaree Mackintosh M.D.   On: 03/21/2013 07:48   Dg Chest Port 1 View  03/20/2013   CLINICAL DATA:  Rule out pneumonia.  EXAM: PORTABLE CHEST - 1 VIEW  COMPARISON:  03/19/2013  FINDINGS: There is an opacity at the left base which is subtle but asymmetric. No effusion (with limitation of excluded lateral left costophrenic sulcus) or pneumothorax. No pulmonary edema. No cardiomegaly.  IMPRESSION: Small left base  infiltrate which could represent pneumonia or atelectasis.   Electronically Signed   By: Jorje Guild M.D.   On: 03/20/2013 06:11    Medications:  Prior to Admission:  Prescriptions prior to admission  Medication Sig Dispense Refill  . amoxicillin-clavulanate (AUGMENTIN) 875-125 MG per tablet Take 1 tablet by mouth 2 (two) times daily. Starting on 03/17/12 for 14 days      . aspirin 81 MG chewable tablet 81 mg.      . Cholecalciferol (VITAMIN D) 2000 UNITS CAPS Take 2,000 Units by mouth daily.      . citalopram (CELEXA) 20 MG tablet Take 20 mg by mouth daily.       Marland Kitchen lisinopril-hydrochlorothiazide (PRINZIDE,ZESTORETIC) 20-25 MG per tablet Take 1 tablet by mouth daily.       . pantoprazole (PROTONIX) 20 MG tablet 20 mg.      . Potassium Citrate 15 MEQ (1620 MG) TBCR Take 1 tablet by mouth daily.       . pravastatin (PRAVACHOL) 40 MG tablet Take 40 mg by mouth daily.       . Vitamins/Minerals TABS Take 1 tablet by mouth daily.        Scheduled: . cefTAZidime (FORTAZ)  IV  2 g Intravenous Q24H  . diltiazem  30 mg Oral Q6H  . levofloxacin (LEVAQUIN) IV  500 mg Intravenous Q48H  . pantoprazole  20 mg Oral Daily  . sodium chloride  3 mL Intravenous Q12H   Continuous: . heparin 2,450 Units/hr (03/21/13 0625)  .  sodium bicarbonate  infusion 1000 mL 50 mL/hr at 03/21/13 0909   OEV:[id number], acetaminophen, calcium carbonate, hydrALAZINE, ondansetron (ZOFRAN) IV, ondansetron  Assessment/Plan: Gram-negative bacteremia in a patient with ileostomy. Currently on double coverage for gram-negative. Patient remains afebrile leukocytosis significantly improved. Continue current antibiotics we will continue to try to obtain final speciation and sensitivity.  Acute on chronic kidney disease check followup creatinine. Studies to date negative for any obstructive pathology. Patient continues with good urinary output.  A. fib rate controlled. On anticoagulation cardiology input pending. 2-D echo  was preserved EF no wall motion abnormality, TSH normal.   History of bladder cancer status post cystectomy and diverting ileostomy   Currently without signs of pneumonia despite left basilar atelectasis versus infiltrate. Patient without fever chills or productive cough   LOS: 2 days   Audrena Talaga D 03/21/2013, 10:33 AM

## 2013-03-22 LAB — BASIC METABOLIC PANEL
BUN: 52 mg/dL — ABNORMAL HIGH (ref 6–23)
CHLORIDE: 102 meq/L (ref 96–112)
CO2: 22 meq/L (ref 19–32)
Calcium: 8.8 mg/dL (ref 8.4–10.5)
Creatinine, Ser: 3.01 mg/dL — ABNORMAL HIGH (ref 0.50–1.35)
GFR calc Af Amer: 22 mL/min — ABNORMAL LOW (ref >90)
GFR calc non Af Amer: 19 mL/min — ABNORMAL LOW (ref >90)
GLUCOSE: 123 mg/dL — AB (ref 70–99)
Potassium: 4.2 mEq/L (ref 3.7–5.3)
Sodium: 141 mEq/L (ref 137–147)

## 2013-03-22 LAB — CBC
HCT: 30.3 % — ABNORMAL LOW (ref 39.0–52.0)
HEMOGLOBIN: 10.4 g/dL — AB (ref 13.0–17.0)
MCH: 29.1 pg (ref 26.0–34.0)
MCHC: 34.3 g/dL (ref 30.0–36.0)
MCV: 84.9 fL (ref 78.0–100.0)
Platelets: 204 10*3/uL (ref 150–400)
RBC: 3.57 MIL/uL — ABNORMAL LOW (ref 4.22–5.81)
RDW: 15.8 % — ABNORMAL HIGH (ref 11.5–15.5)
WBC: 9.2 10*3/uL (ref 4.0–10.5)

## 2013-03-22 LAB — HEPARIN LEVEL (UNFRACTIONATED)
HEPARIN UNFRACTIONATED: 0.43 [IU]/mL (ref 0.30–0.70)
HEPARIN UNFRACTIONATED: 0.49 [IU]/mL (ref 0.30–0.70)

## 2013-03-22 MED ORDER — CIPROFLOXACIN HCL 500 MG PO TABS
500.0000 mg | ORAL_TABLET | Freq: Every day | ORAL | Status: DC
  Administered 2013-03-22 – 2013-03-24 (×3): 500 mg via ORAL
  Filled 2013-03-22 (×3): qty 1

## 2013-03-22 MED ORDER — POLYETHYLENE GLYCOL 3350 17 G PO PACK
17.0000 g | PACK | Freq: Every day | ORAL | Status: DC | PRN
  Filled 2013-03-22 (×2): qty 1

## 2013-03-22 NOTE — Progress Notes (Signed)
ANTICOAGULATION CONSULT NOTE - Follow Up Consult  Pharmacy Consult for Heparin  Indication: atrial fibrillation  No Known Allergies  Patient Measurements: Height: 6' (182.9 cm) Weight: 249 lb 3.2 oz (113.036 kg) IBW/kg (Calculated) : 77.6 Heparin Dosing Weight: ~101 kg  Vital Signs: Temp: 97.7 F (36.5 C) (02/08 1001) Temp src: Oral (02/08 0545) BP: 124/66 mmHg (02/08 1001) Pulse Rate: 93 (02/08 1001)  Labs:  Recent Labs  03/19/13 1445  03/20/13 0220  03/21/13 0440 03/21/13 1420 03/22/13 0033 03/22/13 0550  HGB  --   < > 10.0*  --  10.5*  --   --  10.4*  HCT  --   --  28.8*  --  30.7*  --   --  30.3*  PLT  --   --  137*  --  178  --   --  204  APTT 42*  --   --   --   --   --   --   --   LABPROT 15.7*  --   --   --   --   --   --   --   INR 1.28  --   --   --   --   --   --   --   HEPARINUNFRC  --   < >  --   < > 0.29* 0.20* 0.43 0.49  CREATININE  --   < > 3.60*  --   --  3.12*  --  3.01*  < > = values in this interval not displayed.  Estimated Creatinine Clearance: 26.7 ml/min (by C-G formula based on Cr of 3.01).   Assessment: 80 y/o M on heparin for afib. HL is therapeutic after rate increase. Hgb stable, plt wnl, and renal function improving.   Goal of Therapy:  Heparin level 0.3-0.7 units/ml Monitor platelets by anticoagulation protocol: Yes   Plan:  -Cont heparin drip to 2750 units/hr -8 hour HL at 2330 -Daily CBC/HL -Monitor for bleeding -Determine plans for oral anticoagulations  Posey Pronto, Nichalas Coin M 03/22/2013,10:05 AM

## 2013-03-22 NOTE — Progress Notes (Signed)
ANTIBIOTIC CONSULT NOTE - INITIAL  Pharmacy Consult for Ciprofloxacin Indication: Kleb Bacteremia  No Known Allergies  Patient Measurements: Height: 6' (182.9 cm) Weight: 249 lb 3.2 oz (113.036 kg) IBW/kg (Calculated) : 77.6  Vital Signs: Temp: 97.7 F (36.5 C) (02/08 1001) Temp src: Oral (02/08 0545) BP: 124/66 mmHg (02/08 1001) Pulse Rate: 93 (02/08 1001) Intake/Output from previous day: 02/07 0701 - 02/08 0700 In: 753 [P.O.:600; I.V.:3; IV Piggyback:150] Out: 3240 [Urine:3240] Intake/Output from this shift: Total I/O In: 0  Out: 750 [Urine:750]  Labs:  Recent Labs  03/20/13 0220 03/21/13 0440 03/21/13 1420 03/22/13 0550  WBC 13.7* 12.4*  --  9.2  HGB 10.0* 10.5*  --  10.4*  PLT 137* 178  --  204  CREATININE 3.60*  --  3.12* 3.01*   Estimated Creatinine Clearance: 26.7 ml/min (by C-G formula based on Cr of 3.01). No results found for this basename: VANCOTROUGH, Corlis Leak, VANCORANDOM, GENTTROUGH, GENTPEAK, GENTRANDOM, TOBRATROUGH, TOBRAPEAK, TOBRARND, AMIKACINPEAK, AMIKACINTROU, AMIKACIN,  in the last 72 hours   Microbiology: Recent Results (from the past 720 hour(s))  MRSA PCR SCREENING     Status: None   Collection Time    03/19/13  3:40 AM      Result Value Range Status   MRSA by PCR NEGATIVE  NEGATIVE Final   Comment:            The GeneXpert MRSA Assay (FDA     approved for NASAL specimens     only), is one component of a     comprehensive MRSA colonization     surveillance program. It is not     intended to diagnose MRSA     infection nor to guide or     monitor treatment for     MRSA infections.  URINE CULTURE     Status: None   Collection Time    03/19/13  4:33 AM      Result Value Range Status   Specimen Description URINE, CLEAN CATCH   Final   Special Requests NONE   Final   Culture  Setup Time     Final   Value: 03/19/2013 08:39     Performed at [facility]     Final   Value: 1,000 COLONIES/ML     Performed at  [facility]   Culture     Final   Value: INSIGNIFICANT GROWTH     Performed at [facility]   Report Status 03/20/2013 FINAL   Final  CULTURE, BLOOD (ROUTINE X 2)     Status: None   Collection Time    03/19/13  9:25 AM      Result Value Range Status   Specimen Description BLOOD LEFT HAND   Final   Special Requests BOTTLES DRAWN AEROBIC ONLY 3CC   Final   Culture  Setup Time     Final   Value: 03/19/2013 13:43     Performed at [facility]   Culture     Final   Value:        BLOOD CULTURE RECEIVED NO GROWTH TO DATE CULTURE WILL BE HELD FOR 5 DAYS BEFORE ISSUING A FINAL NEGATIVE REPORT     Performed at [facility]   Report Status PENDING   Incomplete  CULTURE, BLOOD (ROUTINE X 2)     Status: None   Collection Time    03/19/13  9:35 AM      Result Value Range Status  Specimen Description BLOOD LEFT HAND   Final   Special Requests BOTTLES DRAWN AEROBIC ONLY 3CC   Final   Culture  Setup Time     Final   Value: 03/19/2013 13:43     Performed at [facility]   Culture     Final   Value:        BLOOD CULTURE RECEIVED NO GROWTH TO DATE CULTURE WILL BE HELD FOR 5 DAYS BEFORE ISSUING A FINAL NEGATIVE REPORT     Performed at [facility]   Report Status PENDING   Incomplete    Medical History: Past Medical History  Diagnosis Date  . Hypertension   . Cancer   . HLD (hyperlipidemia)     Medications:  Prescriptions prior to admission  Medication Sig Dispense Refill  . amoxicillin-clavulanate (AUGMENTIN) 875-125 MG per tablet Take 1 tablet by mouth 2 (two) times daily. Starting on 03/17/12 for 14 days      . aspirin 81 MG chewable tablet 81 mg.      . Cholecalciferol (VITAMIN D) 2000 UNITS CAPS Take 2,000 Units by mouth daily.      . citalopram (CELEXA) 20 MG tablet Take 20 mg by mouth daily.       Marland Kitchen lisinopril-hydrochlorothiazide (PRINZIDE,ZESTORETIC) 20-25 MG per tablet Take 1 tablet by mouth daily.       . pantoprazole (PROTONIX) 20  MG tablet 20 mg.      . Potassium Citrate 15 MEQ (1620 MG) TBCR Take 1 tablet by mouth daily.       . pravastatin (PRAVACHOL) 40 MG tablet Take 40 mg by mouth daily.       . Vitamins/Minerals TABS Take 1 tablet by mouth daily.        Assessment: 80 yo male presents w/ positive blood culture from outside hospital (2/3 BCx> Kleb oxytoca - pan sensitive). Patient received 4 days of double gram negative coverage. Wbc trending down, afeb, and renal function improving. Per MD would like to narrow therapy to ciprofloxacin.   Cipro 2/8> Zosyn 2/4 >> 2/5 [place] 2/5 >> 2/7 Levaquin 2/5 >> 2/7 Vanc ([facility]) 2/3 x1 CTX ([facility]) 2/4 x1 Cefepime ([facility]) 2/3 x1   2/3 Blood ([facility]) >> Klebsiella Oxytoca (pan sens except amp) 2/4 Urine ([facility]) >>neg 2/5 UA-cloudy 2/5 Urine >>negative (1K insig) 2/5 Blood >>ngtd   Goal of Therapy:  Eradicate Infection  Plan:  - Start Cipro 500mg  IV q24h - Follow up on repeat cultures, clinical progression, and renal function - Follow up on length of therapy (Plan for 14 days)  Angelica Chessman 03/22/2013,10:38 AM

## 2013-03-22 NOTE — Progress Notes (Signed)
ANTICOAGULATION CONSULT NOTE - Follow Up Consult  Pharmacy Consult for Heparin  Indication: atrial fibrillation  No Known Allergies  Patient Measurements: Height: 6' (182.9 cm) Weight: 249 lb 3.2 oz (113.036 kg) IBW/kg (Calculated) : 77.6 Heparin Dosing Weight: ~101 kg  Vital Signs: Temp: 98.1 F (36.7 C) (02/07 2018) Temp src: Oral (02/07 2018) BP: 131/71 mmHg (02/07 2018) Pulse Rate: 104 (02/07 2018)  Labs:  Recent Labs  03/19/13 0750 03/19/13 0935 03/19/13 1445 03/19/13 2020  03/20/13 0220  03/21/13 0440 03/21/13 1420 03/22/13 0033  HGB 10.7*  --   --   --   --  10.0*  --  10.5*  --   --   HCT 30.9*  --   --   --   --  28.8*  --  30.7*  --   --   PLT 139*  --   --   --   --  137*  --  178  --   --   APTT  --   --  42*  --   --   --   --   --   --   --   LABPROT  --   --  15.7*  --   --   --   --   --   --   --   INR  --   --  1.28  --   --   --   --   --   --   --   HEPARINUNFRC  --   --   --   --   < >  --   < > 0.29* 0.20* 0.43  CREATININE 3.76*  --   --  3.55*  --  3.60*  --   --  3.12*  --   TROPONINI  --  <0.30  --   --   --   --   --   --   --   --   < > = values in this interval not displayed.  Estimated Creatinine Clearance: 25.7 ml/min (by C-G formula based on Cr of 3.12).   Medications:  Heparin 2750 units/hr  Assessment: 80 y/o M on heparin for afib. HL is 0.43 after rate increase. Other labs as above. No issues per RN.   Goal of Therapy:  Heparin level 0.3-0.7 units/ml Monitor platelets by anticoagulation protocol: Yes   Plan:  -Continue heparin drip at 2750 units/hr -AM HL to confirm -Daily CBC/HL -Monitor for bleeding  Narda Bonds 03/22/2013,1:27 AM

## 2013-03-22 NOTE — Progress Notes (Signed)
Subjective: No new problems overnight, no fever no chills. Patient continues with good urine output. Final blood culture from previous hospital showed Klebsiella, pansensitive, sensitive to Cipro. Patient tolerating by mouth intake we will convert.  Objective: Vital signs in last 24 hours: Temp:  [97.7 F (36.5 C)-98.6 F (37 C)] 97.7 F (36.5 C) (02/08 1001) Pulse Rate:  [86-104] 93 (02/08 1001) Resp:  [18-24] 21 (02/08 1001) BP: (124-152)/(66-79) 124/66 mmHg (02/08 1001) SpO2:  [96 %-99 %] 97 % (02/08 1001) Weight:  [113.036 kg (249 lb 3.2 oz)] 113.036 kg (249 lb 3.2 oz) (02/07 2018) Weight change: 2.812 kg (6 lb 3.2 oz) Last BM Date: 03/18/13  Intake/Output from previous day: 02/07 0701 - 02/08 0700 In: 753 [P.O.:600; I.V.:3; IV Piggyback:150] Out: 3240 [Urine:3240] Intake/Output this shift: Total I/O In: 0  Out: 750 [Urine:750]  General appearance: alert and cooperative Resp: clear to auscultation bilaterally Cardio: irregularly irregular rhythm GI: Soft, nontender, ostomy in place Extremities: extremities normal, atraumatic, no cyanosis or edema  Lab Results:  Results for orders placed during the hospital encounter of 03/19/13 (from the past 24 hour(s))  HEPARIN LEVEL (UNFRACTIONATED)     Status: Abnormal   Collection Time    03/21/13  2:20 PM      Result Value Range   Heparin Unfractionated 0.20 (*) 0.30 - 0.70 IU/mL  BASIC METABOLIC PANEL     Status: Abnormal   Collection Time    03/21/13  2:20 PM      Result Value Range   Sodium 137  137 - 147 mEq/L   Potassium 4.2  3.7 - 5.3 mEq/L   Chloride 101  96 - 112 mEq/L   CO2 20  19 - 32 mEq/L   Glucose, Bld 148 (*) 70 - 99 mg/dL   BUN 55 (*) 6 - 23 mg/dL   Creatinine, Ser 3.12 (*) 0.50 - 1.35 mg/dL   Calcium 8.6  8.4 - 10.5 mg/dL   GFR calc non Af Amer 18 (*) >90 mL/min   GFR calc Af Amer 21 (*) >90 mL/min  HEPARIN LEVEL (UNFRACTIONATED)     Status: None   Collection Time    03/22/13 12:33 AM      Result  Value Range   Heparin Unfractionated 0.43  0.30 - 0.70 IU/mL  HEPARIN LEVEL (UNFRACTIONATED)     Status: None   Collection Time    03/22/13  5:50 AM      Result Value Range   Heparin Unfractionated 0.49  0.30 - 0.70 IU/mL  CBC     Status: Abnormal   Collection Time    03/22/13  5:50 AM      Result Value Range   WBC 9.2  4.0 - 10.5 K/uL   RBC 3.57 (*) 4.22 - 5.81 MIL/uL   Hemoglobin 10.4 (*) 13.0 - 17.0 g/dL   HCT 30.3 (*) 39.0 - 52.0 %   MCV 84.9  78.0 - 100.0 fL   MCH 29.1  26.0 - 34.0 pg   MCHC 34.3  30.0 - 36.0 g/dL   RDW 15.8 (*) 11.5 - 15.5 %   Platelets 204  150 - 400 K/uL  BASIC METABOLIC PANEL     Status: Abnormal   Collection Time    03/22/13  5:50 AM      Result Value Range   Sodium 141  137 - 147 mEq/L   Potassium 4.2  3.7 - 5.3 mEq/L   Chloride 102  96 - 112 mEq/L   CO2  22  19 - 32 mEq/L   Glucose, Bld 123 (*) 70 - 99 mg/dL   BUN 52 (*) 6 - 23 mg/dL   Creatinine, Ser 3.01 (*) 0.50 - 1.35 mg/dL   Calcium 8.8  8.4 - 10.5 mg/dL   GFR calc non Af Amer 19 (*) >90 mL/min   GFR calc Af Amer 22 (*) >90 mL/min      Studies/Results: Dg Chest Port 1 View  03/21/2013   CLINICAL DATA:  Bacteremia  EXAM: PORTABLE CHEST - 1 VIEW  COMPARISON:  DG CHEST 1V PORT dated 03/20/2013  FINDINGS: Low lung volumes. Cardiac silhouette is unremarkable. Atherosclerotic calcifications project within the arch the aorta. There is decreased conspicuity of the left lower lobe density when compared to the previous study. No new focal regions of consolidation or new focal infiltrates. The osseous structures unremarkable.  IMPRESSION: Decreased conspicuity of the left lung base density. Differential considerations are again atelectasis versus infiltrate.   Electronically Signed   By: Margaree Mackintosh M.D.   On: 03/21/2013 07:48    Medications:  Prior to Admission:  Prescriptions prior to admission  Medication Sig Dispense Refill  . amoxicillin-clavulanate (AUGMENTIN) 875-125 MG per tablet Take 1  tablet by mouth 2 (two) times daily. Starting on 03/17/12 for 14 days      . aspirin 81 MG chewable tablet 81 mg.      . Cholecalciferol (VITAMIN D) 2000 UNITS CAPS Take 2,000 Units by mouth daily.      . citalopram (CELEXA) 20 MG tablet Take 20 mg by mouth daily.       Marland Kitchen lisinopril-hydrochlorothiazide (PRINZIDE,ZESTORETIC) 20-25 MG per tablet Take 1 tablet by mouth daily.       . pantoprazole (PROTONIX) 20 MG tablet 20 mg.      . Potassium Citrate 15 MEQ (1620 MG) TBCR Take 1 tablet by mouth daily.       . pravastatin (PRAVACHOL) 40 MG tablet Take 40 mg by mouth daily.       . Vitamins/Minerals TABS Take 1 tablet by mouth daily.        Scheduled: . ciprofloxacin  500 mg Oral Daily  . diltiazem  30 mg Oral Q6H  . pantoprazole  20 mg Oral Daily  . sodium chloride  3 mL Intravenous Q12H   Continuous: . heparin 2,750 Units/hr (03/22/13 0819)  .  sodium bicarbonate  infusion 1000 mL 50 mL/hr at 03/22/13 4403   KVQ:QVZDGLOVFIEPP, acetaminophen, calcium carbonate, hydrALAZINE, menthol-cetylpyridinium, ondansetron (ZOFRAN) IV, ondansetron  Assessment/Plan: Gram-negative bacteremia in a patient with ileostomy. Currently on double coverage for gram-negative. Patient remains afebrile leukocytosis significantly improved. Culture shows Klebsiella essentially pansensitive, we will convert to oral Cipro. Acute on chronic kidney disease check followup creatinine. Studies to date negative for any obstructive pathology. Patient continues with good urinary output. Renal function slowly improved we will continue to monitor. He is taking in adequate by mouth intake therefore we will DC IV fluids A. fib rate controlled. On anticoagulation . 2-D echo was preserved EF no wall motion abnormality, TSH normal. We'll need to make decision on prolonged anticoagulation. History of bladder cancer status post cystectomy and diverting ileostomy  Currently without signs of pneumonia despite left basilar atelectasis versus  infiltrate. Patient without fever chills or productive cough. Check followup x-ray   LOS: 3 days   Solon Alban D 03/22/2013, 10:27 AM

## 2013-03-23 LAB — CBC
HCT: 31.3 % — ABNORMAL LOW (ref 39.0–52.0)
Hemoglobin: 10.7 g/dL — ABNORMAL LOW (ref 13.0–17.0)
MCH: 28.9 pg (ref 26.0–34.0)
MCHC: 34.2 g/dL (ref 30.0–36.0)
MCV: 84.6 fL (ref 78.0–100.0)
Platelets: 253 10*3/uL (ref 150–400)
RBC: 3.7 MIL/uL — AB (ref 4.22–5.81)
RDW: 15.6 % — ABNORMAL HIGH (ref 11.5–15.5)
WBC: 10.7 10*3/uL — ABNORMAL HIGH (ref 4.0–10.5)

## 2013-03-23 LAB — HEPARIN LEVEL (UNFRACTIONATED): Heparin Unfractionated: 0.61 IU/mL (ref 0.30–0.70)

## 2013-03-23 MED ORDER — CITALOPRAM HYDROBROMIDE 20 MG PO TABS
20.0000 mg | ORAL_TABLET | Freq: Every day | ORAL | Status: DC
  Administered 2013-03-23 – 2013-03-24 (×2): 20 mg via ORAL
  Filled 2013-03-23 (×2): qty 1

## 2013-03-23 MED ORDER — SIMVASTATIN 10 MG PO TABS
10.0000 mg | ORAL_TABLET | Freq: Every day | ORAL | Status: DC
  Administered 2013-03-23: 10 mg via ORAL
  Filled 2013-03-23 (×2): qty 1

## 2013-03-23 MED ORDER — DILTIAZEM HCL ER COATED BEADS 180 MG PO CP24
180.0000 mg | ORAL_CAPSULE | Freq: Every day | ORAL | Status: DC
  Administered 2013-03-23: 180 mg via ORAL
  Filled 2013-03-23 (×2): qty 1

## 2013-03-23 MED ORDER — RIVAROXABAN 15 MG PO TABS
15.0000 mg | ORAL_TABLET | Freq: Every day | ORAL | Status: DC
Start: 2013-03-23 — End: 2013-03-24
  Administered 2013-03-23: 15 mg via ORAL
  Filled 2013-03-23 (×2): qty 1

## 2013-03-23 NOTE — Evaluation (Signed)
Physical Therapy Evaluation Patient Details Name: Jose Nash MRN: [id number] DOB: February 17, 1933 Today's Date: 03/23/2013 Time: 1000-1021 PT Time Calculation (min): 21 min  PT Assessment / Plan / Recommendation History of Present Illness  Jose Nash is a 80 y.o. male with history of bladder cancer status post diverting ileostomy, hypertension and hyperlipidemia, nephrolithiasis was advised to go to the hospital by his urologist ordered patient was found to have significant leukocytosis  Clinical Impression  Patient presents with decreased independence with mobility due to deficits listed below.  He will benefit from skilled PT in the acute setting to allow return home with intermittent family support and HHPT.  Will benefit from nursing assist to walk in hallway with walker at least 2 more times today.    PT Assessment  Patient needs continued PT services    Follow Up Recommendations  Home health PT;Supervision - Intermittent    Does the patient have the potential to tolerate intense rehabilitation    N/A  Barriers to Discharge Decreased caregiver support      Equipment Recommendations  None recommended by PT    Recommendations for Other Services OT consult   Frequency Min 3X/week    Precautions / Restrictions Precautions Precautions: Fall Precaution Comments: reports 10 falls in 6 months due to neuropathy   Pertinent Vitals/Pain No pain complaints; HR 113, SpO2 96% ambulating on room air      Mobility  Bed Mobility Overal bed mobility: Modified Independent General bed mobility comments: with effort and heavy use of rail Transfers Overall transfer level: Modified independent General transfer comment: stood unsupported without LOB with wide base of support Ambulation/Gait Ambulation/Gait assistance: Supervision;Min guard Ambulation Distance (Feet): 160 Feet Assistive device: Rolling walker (2 wheeled) Gait Pattern/deviations: Step-through pattern;Trunk  flexed;Decreased stride length General Gait Details: one episode right knee buckling; walking some in room without walker reaching for items to touch for balance; cues for safety with turns with walker    Exercises     PT Diagnosis: Abnormality of gait;Generalized weakness  PT Problem List: Decreased strength;Decreased balance;Decreased mobility;Decreased knowledge of use of DME;Decreased safety awareness PT Treatment Interventions: DME instruction;Functional mobility training;Balance training;Patient/family education;Gait training;Stair training;Therapeutic exercise;Therapeutic activities     PT Goals(Current goals can be found in the care plan section) Acute Rehab PT Goals Patient Stated Goal: To go home tomorrow PT Goal Formulation: With patient Time For Goal Achievement: 04/06/13 Potential to Achieve Goals: Good  Visit Information  Last PT Received On: 03/23/13 Assistance Needed: +1 History of Present Illness: Jose Nash is a 80 y.o. male with history of bladder cancer status post diverting ileostomy, hypertension and hyperlipidemia, nephrolithiasis was advised to go to the hospital by his urologist ordered patient was found to have significant leukocytosis       Prior [place] expects to be discharged to:: Private residence Living Arrangements: Spouse/significant other Available Help at Discharge: Family;Available PRN/intermittently Type of Home: House Home Access: Stairs to enter Home Layout: One level Home Equipment: Environmental consultant - 2 wheels;Bedside commode Additional Comments: used BSC as shower seat Prior Function Level of Independence: Independent Comments: doesn all house hold work as wife is frail from fighting colon cancer Communication Communication: No difficulties    Cognition  Cognition Arousal/Alertness: Awake/alert Behavior During Therapy: WFL for tasks assessed/performed Overall Cognitive Status: Within Functional Limits  for tasks assessed    Extremity/Trunk Assessment Lower Extremity Assessment Lower Extremity Assessment: RLE deficits/detail;LLE deficits/detail RLE Deficits / Details: grossly 4/5 strength throughout RLE Sensation:  history of peripheral neuropathy LLE Deficits / Details: grossly 4/5 strength throughout LLE Sensation: history of peripheral neuropathy   Balance Balance Overall balance assessment: Needs assistance;History of Falls Standing balance support: No upper extremity supported Standing balance-Leahy Scale: Fair Standing balance comment: stands with wide base without UE assist, needs to touch items to take steps  End of Session PT - End of Session Equipment Utilized During Treatment: Gait belt Activity Tolerance: Patient tolerated treatment well Patient left: in chair;with call bell/phone within reach;with family/visitor present  GP     [facility] 03/23/2013, 11:29 AM Magda Kiel, [place] 03/23/2013

## 2013-03-23 NOTE — Progress Notes (Signed)
Subjective: No complaints  Objective: Vital signs in last 24 hours: Temp:  [97.7 F (36.5 C)-98.7 F (37.1 C)] 98.3 F (36.8 C) (02/09 0500) Pulse Rate:  [93-123] 110 (02/09 0500) Resp:  [20-21] 20 (02/09 0500) BP: (124-159)/(66-89) 141/88 mmHg (02/09 0500) SpO2:  [94 %-97 %] 97 % (02/09 0500) Weight:  [108.682 kg (239 lb 9.6 oz)] 108.682 kg (239 lb 9.6 oz) (02/08 2100) Weight change: -4.355 kg (-9 lb 9.6 oz) Last BM Date: 03/18/13  Intake/Output from previous day: 02/08 0701 - 02/09 0700 In: 3 [I.V.:3] Out: 3375 [Urine:3375] Intake/Output this shift:    General appearance: alert and cooperative Resp: clear to auscultation bilaterally Cardio: irregularly irregular rhythm GI: soft, non-tender; bowel sounds normal; no masses,  no organomegaly Extremities: extremities normal, atraumatic, no cyanosis or edema  Lab Results:  Recent Labs  03/22/13 0550 03/23/13 0643  WBC 9.2 10.7*  HGB 10.4* 10.7*  HCT 30.3* 31.3*  PLT 204 253   BMET  Recent Labs  03/21/13 1420 03/22/13 0550  NA 137 141  K 4.2 4.2  CL 101 102  CO2 20 22  GLUCOSE 148* 123*  BUN 55* 52*  CREATININE 3.12* 3.01*  CALCIUM 8.6 8.8    Studies/Results: Dg Chest Port 1 View  03/21/2013   CLINICAL DATA:  Bacteremia  EXAM: PORTABLE CHEST - 1 VIEW  COMPARISON:  DG CHEST 1V PORT dated 03/20/2013  FINDINGS: Low lung volumes. Cardiac silhouette is unremarkable. Atherosclerotic calcifications project within the arch the aorta. There is decreased conspicuity of the left lower lobe density when compared to the previous study. No new focal regions of consolidation or new focal infiltrates. The osseous structures unremarkable.  IMPRESSION: Decreased conspicuity of the left lung base density. Differential considerations are again atelectasis versus infiltrate.   Electronically Signed   By: Margaree Mackintosh M.D.   On: 03/21/2013 07:48    Medications: I have reviewed the patient's current  medications.  Assessment/Plan: Gram-negative bacteremia/klebsiella, now on po cipro, plan is for 14 days of antibiotics  Acute on chronic kidney disease check followup creatinine. Studies to date negative for any obstructive pathology. Patient continues with good urinary output. Renal function slowly improved we will continue to monitor. He is taking in adequate by mouth intake, IVFs discontinued  A. fib rate not controlled. Increase diltiazem to  180 mg a day.  D/c heparin and start xarelto 15 mg a day  History of bladder cancer status post cystectomy and diverting ileostomy   Currently without signs of pneumonia despite left basilar atelectasis versus infiltrate. Patient without fever chills or productive cough.   Disposition  PT consult today, hope to discharge  2/10     LOS: 4 days   Jose Nash 03/23/2013, 7:29 AM

## 2013-03-24 LAB — CBC
HEMATOCRIT: 32.1 % — AB (ref 39.0–52.0)
HEMOGLOBIN: 11 g/dL — AB (ref 13.0–17.0)
MCH: 29.5 pg (ref 26.0–34.0)
MCHC: 34.3 g/dL (ref 30.0–36.0)
MCV: 86.1 fL (ref 78.0–100.0)
Platelets: 283 10*3/uL (ref 150–400)
RBC: 3.73 MIL/uL — ABNORMAL LOW (ref 4.22–5.81)
RDW: 15.6 % — ABNORMAL HIGH (ref 11.5–15.5)
WBC: 11.3 10*3/uL — ABNORMAL HIGH (ref 4.0–10.5)

## 2013-03-24 LAB — BASIC METABOLIC PANEL
BUN: 46 mg/dL — AB (ref 6–23)
CO2: 22 mEq/L (ref 19–32)
Calcium: 9 mg/dL (ref 8.4–10.5)
Chloride: 105 mEq/L (ref 96–112)
Creatinine, Ser: 2.81 mg/dL — ABNORMAL HIGH (ref 0.50–1.35)
GFR calc Af Amer: 23 mL/min — ABNORMAL LOW (ref >90)
GFR calc non Af Amer: 20 mL/min — ABNORMAL LOW (ref >90)
GLUCOSE: 124 mg/dL — AB (ref 70–99)
POTASSIUM: 4.4 meq/L (ref 3.7–5.3)
Sodium: 142 mEq/L (ref 137–147)

## 2013-03-24 MED ORDER — DILTIAZEM HCL ER COATED BEADS 240 MG PO CP24
240.0000 mg | ORAL_CAPSULE | Freq: Every day | ORAL | Status: DC
  Administered 2013-03-24: 240 mg via ORAL
  Filled 2013-03-24: qty 1

## 2013-03-24 MED ORDER — DILTIAZEM HCL ER 240 MG PO CP24: 240.0000 mg | ORAL_CAPSULE | Freq: Every day | ORAL | Status: AC

## 2013-03-24 MED ORDER — RIVAROXABAN 15 MG PO TABS: 15.0000 mg | ORAL_TABLET | Freq: Every day | ORAL | Status: AC

## 2013-03-24 MED ORDER — CIPROFLOXACIN HCL 500 MG PO TABS: 500.0000 mg | ORAL_TABLET | Freq: Every day | ORAL | Status: AC

## 2013-03-24 MED ORDER — OMEPRAZOLE 20 MG PO CPDR: 20.0000 mg | DELAYED_RELEASE_CAPSULE | Freq: Every day | ORAL | Status: AC

## 2013-03-24 NOTE — Care Management Note (Signed)
   CARE MANAGEMENT NOTE 03/24/2013  Patient:  Jose Nash, Jose Nash   Account Number:  [id number]  Date Initiated:  03/19/2013  Documentation initiated by:  AMERSON,JULIE  Subjective/Objective Assessment:   PT ADM ON 03/19/13 WITH SEPSIS, UTI, AFIB.  PTA, PT RESIDES AT [place].     Action/Plan:   PT ON HEPARIN AND CARDIZEM DRPS, IV ABX; WILL FOLLOW FOR DC PLANNING AS PT PROGRESSES.  03/23/13 Met with pt and he selected [facility] for hHRN and PT. [place] notified. Pt has DME.   Anticipated DC Date:  03/24/2013   Anticipated DC Plan:  [place]  CM consult      [facility] Choice  HOME HEALTH   Choice offered to / List presented to:  C-1 Patient        [place] arranged  HH-1 RN  [place].   Status of service:  Completed, signed off Medicare Important Message given?   (If response is "NO", the following Medicare IM given date fields will be blank) Date Medicare IM given:   Date Additional Medicare IM given:    Discharge Disposition:  [place]  Per UR Regulation:  Reviewed for med. necessity/level of care/duration of stay  If discussed at [place] of Stay Meetings, dates discussed:    Comments:  03/24/2012 Met with pt who selected [facility] for [facility] services, pt has walker and shower chair no other DME needs at this time. [place] notified and services will begin 24-48 hr post d/c. Jasmine Pang RN MPH, [phone]

## 2013-03-24 NOTE — Progress Notes (Signed)
Physical Therapy Treatment Patient Details Name: Jose Nash MRN: [id number] DOB: 1934-02-09 Today's Date: 03/24/2013 Time: 6073-7106 PT Time Calculation (min): 25 min  PT Assessment / Plan / Recommendation  History of Present Illness Jose Nash is a 80 y.o. male with history of bladder cancer status post diverting ileostomy, hypertension and hyperlipidemia, nephrolithiasis was advised to go to the hospital by his urologist ordered patient was found to have significant leukocytosis   PT Comments   Patient progressing some with balance and activity tolerance, but continues to admit that balance is his biggest limitation.  Discussed follow up outpatient PT following HHPT.  Patient to follow up with MD in about a week following d/c.  Printed and gave referral forms for outpatient PT to take to MD.  Follow Up Recommendations  Home health PT;Supervision - Intermittent     Does the patient have the potential to tolerate intense rehabilitation   N/A  Barriers to Discharge  None      Equipment Recommendations  None recommended by PT    Recommendations for Other Services  None  Frequency Min 3X/week   Progress towards PT Goals Progress towards PT goals: Progressing toward goals  Plan Current plan remains appropriate    Precautions / Restrictions Precautions Precautions: Fall Precaution Comments: reports 10 falls in 6 months due to neuropathy Restrictions Weight Bearing Restrictions: No   Pertinent Vitals/Pain No pain complaints    Mobility  Bed Mobility Overal bed mobility: Modified Independent General bed mobility comments: raised HOB and used rail Transfers Overall transfer level: Modified independent Ambulation/Gait Ambulation/Gait assistance: Supervision Ambulation Distance (Feet): 150 Feet Assistive device: Rolling walker (2 wheeled) Gait Pattern/deviations: Step-through pattern;Wide base of support General Gait Details: walked in hallway with walker, noted  head turn to right when turning right without LOB, but in room without walker, turned quickly and admitted sometimes gets ahead of his feet and feels off balance, discussed neuropathy and vision issues increase fall risk. Stairs: Yes Stairs assistance: Supervision Stair Management: Two rails;Step to pattern;Forwards;Alternating pattern Number of Stairs: 4 General stair comments: step through pattern to ascend, step to for descent      PT Goals (current goals can now be found in the care plan section)    Visit Information  Last PT Received On: 03/24/13 Assistance Needed: +1 History of Present Illness: Jose Nash is a 80 y.o. male with history of bladder cancer status post diverting ileostomy, hypertension and hyperlipidemia, nephrolithiasis was advised to go to the hospital by his urologist ordered patient was found to have significant leukocytosis    Subjective Data   Going home today.   Cognition  Cognition Arousal/Alertness: Awake/alert Behavior During Therapy: WFL for tasks assessed/performed Overall Cognitive Status: Within Functional Limits for tasks assessed    Balance  Balance Overall balance assessment: Needs assistance Sitting balance-Leahy Scale: Good Standing balance-Leahy Scale: Fair Standing balance comment: able to balance without UE support today to take some steps in the room, but wide based and with increased time due to unsteadiness High Level Balance Comments: heel raises holding counter x 10, wall bumps with cues x 5  End of Session PT - End of Session Equipment Utilized During Treatment: Gait belt Activity Tolerance: Patient tolerated treatment well Patient left: in bed;with call bell/phone within reach   GP     [facility] 03/24/2013, 9:59 AM Magda Kiel, [place] 03/24/2013

## 2013-03-24 NOTE — Discharge Summary (Signed)
Physician Discharge Summary  Patient ID: Jose Nash MRN: [id number] DOB/AGE: 78-18-1935 78 y.o.  Admit date: 03/19/2013 Discharge date: 03/24/2013  Admission Diagnoses: Gram-negative bacteremia UTI Bladder cancer Acute on chronic renal failure Hypertension Nephrolithiasis Anemia  Discharge Diagnoses:  Principal Problem:   Gram-negative bacteremia Active Problems:   Bladder cancer   Hypertension   Renal failure (ARF), acute on chronic   Anemia   Nephrolithiasis   Discharged Condition: good  Hospital Course: The patient was admitted on 03/19/2013 in transfer from [facility] on [address]. He had been sent to the hospital by his urologist after having been found to have significant leukocytosis, left flank pain and subjective fever and chills. A CT scan of the abdomen and pelvis was done and showed passage of a known renal stone in the left kidney. In the emergency room the patient was initially found to be hypotensive with blood pressure in the 70s and was started on vancomycin and Zosyn. Creatinine was initially 2.5 up from a baseline of 1.8. The patient was fluid resuscitated with improvement of blood pressure. Blood cultures were gram-negative rods eventually shown to be Klebsiella pneumoniae. The patient was transferred to [facility] at his request. On admission here his creatinine was found to be 3.76. He did have a anion gap metabolic acidosis. White count was 14,000. Renal ultrasound was negative. Chest x-ray showed left lung base density, atelectasis versus infiltrate, the patient had no symptoms of pneumonia. His vital signs remained stable. He was admitted and placed on Zosyn initially. He was given IV fluids and her antihypertensives were held. On the morning after admission he went into rapid atrial fibrillation. He was started on diltiazem drip with good control of his heart rate. He was transferred to the critical care service. Echocardiogram showed normal  systolic function, normal wall thickness and no wall motion abnormalities, mild left atrial dilation. He was started on IV heparin. Cardiac enzymes were negative. TSH was 0.75. The patient was switched to [place] the. He became afebrile and vital signs remained stable. Blood cultures eventually showed Klebsiella pneumoniae, pansensitive. He was switched to Cipro. The patient's renal function gradually improved and was pending at the time of this dictation. His urine output was good. For anticoagulation he was switched from heparin to xarelto dose adjusted for his renal function. The patient was seen by physical therapy one day prior to discharge and ambulated well, home physical therapy was recommended. He remained off lisinopril CT and on diltiazem for heart rate control and blood pressure.  Consults: pulmonary/intensive care  Significant Diagnostic Studies: labs basic metabolic profile February 8 sodium 141, potassium 4.2 chloride 102 bicarbonate 22 BUN 52 creatinine 3.01 , WBC 9.2 microbiology: blood culture: negative and radiology: CXR: Left base atelectasis versus infiltrate, renal ultrasound was negative for hydronephrosis  Treatments: IV hydration, antibiotics: Cipro and Ceftazadime, anticoagulation: heparin and xarelto and therapies: PT  Discharge Exam: Blood pressure 126/85, pulse 106, temperature 98.1 F (36.7 C), temperature source Oral, resp. rate 22, height 6' (1.829 m), weight 112.674 kg (248 lb 6.4 oz), SpO2 94.00%. General appearance: alert and cooperative Resp: clear to auscultation bilaterally Cardio: irregularly irregular rhythm  Disposition: Home     Medication List    STOP taking these medications       amoxicillin-clavulanate 875-125 MG per tablet  Commonly known as:  AUGMENTIN     aspirin 81 MG chewable tablet     lisinopril-hydrochlorothiazide 20-25 MG per tablet  Commonly known as:  PRINZIDE,ZESTORETIC  pantoprazole 20 MG tablet  Commonly known as:  PROTONIX       TAKE these medications       ciprofloxacin 500 MG tablet  Commonly known as:  CIPRO  Take 1 tablet (500 mg total) by mouth daily.     citalopram 20 MG tablet  Commonly known as:  CELEXA  Take 20 mg by mouth daily.     diltiazem 240 MG 24 hr capsule  Commonly known as:  DILACOR XR  Take 1 capsule (240 mg total) by mouth daily.     omeprazole 20 MG capsule  Commonly known as:  PRILOSEC  Take 1 capsule (20 mg total) by mouth daily.     Potassium Citrate 15 MEQ (1620 MG) Tbcr  Take 1 tablet by mouth daily.     pravastatin 40 MG tablet  Commonly known as:  PRAVACHOL  Take 40 mg by mouth daily.     Rivaroxaban 15 MG Tabs tablet  Commonly known as:  XARELTO  Take 1 tablet (15 mg total) by mouth daily with supper.     Vitamin D 2000 UNITS Caps  Take 2,000 Units by mouth daily.     Vitamins/Minerals Tabs  Take 1 tablet by mouth daily.           Follow-up Information   Follow up with Irven Shelling, MD In 1 week.   Specialty:  Internal Medicine   Contact information:   [address] [place] [address] [phone]       Signed: Irven Shelling 03/24/2013, 7:31 AM

## 2013-03-25 LAB — CULTURE, BLOOD (ROUTINE X 2)
CULTURE: NO GROWTH
Culture: NO GROWTH

## 2014-11-13 DEATH — deceased

## 2019-12-14 DEATH — deceased

## 2020-05-13 DEATH — deceased
